# Patient Record
Sex: Male | Born: 1990 | Race: White | Hispanic: No | Marital: Married
Health system: Western US, Academic
[De-identification: ages and names within clinical notes are randomized; demographics above are authoritative.]

---

## 2017-12-07 ENCOUNTER — Emergency Department
Admission: EM | Admit: 2017-12-07 | Discharge: 2017-12-07 | Disposition: A | Discharge status: Home / Self Care | Payer: TRICARE Prime—HMO | Attending: Emergency Medicine | Admitting: Emergency Medicine

## 2017-12-07 ENCOUNTER — Emergency Department (EMERGENCY_DEPARTMENT_HOSPITAL): Payer: TRICARE Prime—HMO

## 2017-12-07 DIAGNOSIS — T703XXA Caisson disease [decompression sickness], initial encounter: Secondary | ICD-10-CM

## 2017-12-07 DIAGNOSIS — Y991 Military activity: Secondary | ICD-10-CM | POA: Insufficient documentation

## 2017-12-07 DIAGNOSIS — E876 Hypokalemia: Secondary | ICD-10-CM | POA: Insufficient documentation

## 2017-12-07 LAB — CBC WITH DIFF, BLOOD
ANC-Automated: 5.9 10*3/uL (ref 1.6–7.0)
Abs Basophils: 0 10*3/uL (ref <0.1)
Abs Eosinophils: 0.2 10*3/uL (ref 0.1–0.5)
Abs Lymphs: 2 10*3/uL (ref 0.8–3.1)
Abs Monos: 0.7 10*3/uL (ref 0.2–0.8)
Basophils: 0 %
Eosinophils: 2 %
Hct: 40 % (ref 40.0–50.0)
Hgb: 13.8 gm/dL (ref 13.7–17.5)
Lymphocytes: 22 %
MCH: 31.4 pg (ref 26.0–32.0)
MCHC: 34.5 g/dL (ref 32.0–36.0)
MCV: 90.9 um3 (ref 79.0–95.0)
MPV: 11.7 fL (ref 9.4–12.4)
Monocytes: 8 %
Plt Count: 232 10*3/uL (ref 140–370)
RBC: 4.4 10*6/uL — ABNORMAL LOW (ref 4.60–6.10)
RDW: 12.3 % (ref 12.0–14.0)
Segs: 68 %
WBC: 8.7 10*3/uL (ref 4.0–10.0)

## 2017-12-07 LAB — URINALYSIS WITH CULTURE REFLEX, WHEN INDICATED
Bilirubin: NEGATIVE
Blood: NEGATIVE
Glucose: NEGATIVE
Ketones: NEGATIVE
Leuk Esterase: NEGATIVE
Nitrite: NEGATIVE
Protein: NEGATIVE
Specific Gravity: 1.01 (ref 1.002–1.030)
Urobilinogen: NEGATIVE
pH: 7 (ref 5.0–8.0)

## 2017-12-07 LAB — UR DRUGS OF ABUSE SCREEN
Amphetamines Screen: NEGATIVE
Barbiturates Screen: NEGATIVE
Benzodiazepine Screen: NEGATIVE
Cocaine Screen: NEGATIVE
Methadone Screen: NEGATIVE
Opiates Screen: NEGATIVE
Oxycodone Screen: NEGATIVE
Phencyclidine Screen: NEGATIVE
THC Screen: NEGATIVE

## 2017-12-07 LAB — BASIC METABOLIC PANEL, BLOOD
Anion Gap: 14 mmol/L (ref 7–15)
BUN: 16 mg/dL (ref 6–20)
Bicarbonate: 25 mmol/L (ref 22–29)
Calcium: 10.2 mg/dL (ref 8.5–10.6)
Chloride: 99 mmol/L (ref 98–107)
Creatinine: 0.93 mg/dL (ref 0.67–1.17)
GFR: >60 mL/min
Glucose: 104 mg/dL — ABNORMAL HIGH (ref 70–99)
Potassium: 3.3 mmol/L — ABNORMAL LOW (ref 3.5–5.1)
Sodium: 138 mmol/L (ref 136–145)

## 2017-12-07 LAB — CARBOXYHEMOGLOBIN, VENOUS: CO Hgb, Ven: 1.3 %

## 2017-12-07 LAB — HCV ANTIBODY WITH REFLEX QUANT: Hepatitis C Ab: REACTIVE — AB

## 2017-12-07 MED ORDER — LACTATED RINGERS IV SOLN
Freq: Once | INTRAVENOUS | Status: AC
Start: 2017-12-07 — End: 2017-12-07
  Administered 2017-12-07: 02:00:00 via INTRAVENOUS

## 2017-12-07 NOTE — ED Notes (Signed)
12/07/2017 12:41 AM David Ballard    An EKG was handed to Dr. Randell Patientaccese. Signed copy placed in EKG binder.

## 2017-12-07 NOTE — ED MD Progress Note (Signed)
Patient completed HBO chamber dive unremarkably and stated his symptoms resolved. Remained HDS throughout sign-out stay, tolerated PO, and was ambulating per their baseline. Return precautions were discussed, patient expressed understanding and was amenable to follow-up plan.

## 2017-12-07 NOTE — Interdisciplinary (Signed)
12/07/17 0846   Assessment   Assessment Type Discharge  (transportation assistance )   Referral Information   Referral Type Discharge Planning     ED SW was asked by ED Nursing to assist pt with transportation.     ED SW secured Lyft ride to [place], per nursing request.     PLAN: ED SW to remain available as needed     Deirdre PriestGraciela Denice Dmitry Macomber, MSW  Pager: [phone]  Cell phone: [phone]

## 2017-12-07 NOTE — Consults (Signed)
Hyperbaric Medicine Consultation    Indication for treatment: Altitude Proofreader) decompression sickness    Physician requesting consultation: Ulysees Barns    Chief Complaint   Patient presents with   . Difficulty Breathing     Patient BIBM from [facility] d/t patient c/o difficulty breathing, weakness, and generalized numbness and tingling and transient disorientation s/p rapidly decending from approximately 6,000 feet from an [employer].  Patient denies chest pain/pressure, N/V, headache, or syncope.  Patient denies PMH.          History of present illness:   27 year old M with no PMHx who presents to [place] ED for complications related to aircraft malfunction.    Patient on 12/02/2017 initially had a routine flight and upon landing he felt some left arm numbness. His co-pilot felt some changes in pressure in the aircraft in that flight, but when the aircraft was analyzed there was no issues found. The patient was cleared to go back to flying last night on 12/06/2017.    His first flight of the night was uneventful. During the second flight of the night, he went up to 12,000 feet. At that time there was an electrical alarm and both him and his WSO were required to start breathing 100% oxygen and come down to 10,000 feet. At that point the pilot felt as though he lost flow to his mask and felt symptoms of hypoxia including tunnel vision and disorientation. The decision was made to come back to shore. The cockpit at that time started to have violent fluctuations in cabin pressure, at least between 4-12,000 feet and possibly more since it was pitch black and the pilot could no longer see the controls. This continued for at least 25 minutes and the pressure would change around every 3 minutes. They were finally able to fly at ambient pressure for about 25 minutes before reaching the shore around 23:30 on Dec 1st.     The patient's main complaint upon landing was disorientation. Currently the patient feels  heaviness in his legs, numbness and tingling in his left leg and especially in his left foot, numbness in some of his toes on the right foot, numbness in his left leg along with some cramping, and some numbness and tingling in his left hand. He also feels some tingling in his chest but he denies any chest pain.     He had a WSO on the flight with him that did confirm the story but he did not have any symptoms of decompression sickness. He is completely asymptomatic currently. He also was not the WSO that he had several days ago when David Ballard developed symptoms of left arm numbness.    He denies a history of seizures, lung disease, sinus disease, or difficulty clearing his ears.      Past Medical History:  No dm or htn  No history of decompression sickness    Medications:  No current facility-administered medications for this encounter.      No current outpatient medications on file.       Allergies:  Patient has no known allergies.    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         ROS:  Constitutional: negative.  Eyes: negative.  Ears, Nose, Mouth, Throat: negative but some ear pain on his left  side.  CV: negative.  Resp: negative.  Musculoskeletal: negative.  Integumentary: negative.  Neuro: numbness or tingling.  Psych: negative.  Endo: negative.  Heme/Lymphatic: negative.  A complete ROS was done and was negative except as stated above.      Physical Exam:    Vitals: BP 125/74   Pulse 85   Temp 98.3 F (36.8 C)   Resp 16   Ht 5\' 7"  (1.702 m)   Wt 77.1 kg (170 lb)   SpO2 100%   BMI 26.63 kg/m   Vs noted. Afebrile. o2 sat is 100% on room air which is normal.   General Appearance: healthy, alert, no distress, pleasant affect, cooperative.  HEENT: ncat  Eyes: conjunctivae and corneas clear. PERRL, EOM's intact.  Ears: tympanic membranes mobile with valsalva, left canal erythematous and  right canal normal  Heart: normal rate and regular rhythm, no murmurs, clicks, or gallops.  Lungs: clear to auscultation and percussion, no chest deformities noted.  Back: no cvat. No spinal ttp.   Abdomen: Abdomen soft, non-tender. No masses or organomegaly. Bowel sounds normal.  Extremities: no cyanosis, clubbing, or edema.  Neurological Exam: CN II-XII present. No weakness noted in all 4 extremities. Sensation decreased to lt in the left leg.  Normal fnf bilaterally. Normal gait.    Mini-mental exam: some difficulty with subtraction section (count back from 100 by 7). He also did not know he was at [place] but he knew exactly where he was transferred from and [place]    WoundDescription: N/A    Impression:  1. Aviation or Altitude decompression sickness  David Ballard is an excellent candidate for hyperbaric oxygen (HBO2) treatment as primary therapy for the treatment of Decompression Sickness. He had multiple violent and immediate cabin pressure changes with residual neurological symptoms upon landing.    Informed Consent:  David Ballard has no contraindications to hyperbaric oxygen therapy.  We have discussed the risks (ear and sinus barotrauma, pneumothorax, visual refractory changes,oxygen-induced seizures, arterial gas embolism, and claustrophobia) and potential benefits of hyperbaric oxygen therapy with David Ballard.  He understands these risks along with the potential benefits and agrees to undergo hyperbaric oxygen therapy.    Plan:   We are ordering a course of therapy with a treatment table 6 with 100% oxygen immediately. We will continue to assess his status during treatment and assess if he will need any extensions or further treatments.    Dahlia BailiffMahmoud Sabha, MD  [facility] Hyperbaric Medicine Fellow    Hyperbaric attending physician attestation:  The patient was seen and evaluated with the hyperbaric fellow, Dr. Dahlia BailiffMahmoud Sabha.  I have reviewed the above note and made changes where  appropriate.  I agree with the history, physical exam, assessment, and plan.  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  He felt immediate symptoms including numbness in his left leg and both feet, generalized weakness and malaise, and decreased cognitive ability.  On exam he had some decreased sensation to light touch in his left leg.  He also had problems on the mini mental status exam doing serial 7's.  We plan to treat him with 100% oxygen on a TT-6.  No flying until cleared by his Sport and exercise psychologistflight surgeon.

## 2017-12-07 NOTE — ED Notes (Signed)
Pt brought back from hyperbarics by Dr. Noralee StainGrover

## 2017-12-07 NOTE — ED MD Progress Note (Signed)
Pt completed a TT-6 without any problems.  He states his symptoms have 95% resolved.  His cognitive abilities are back to normal.  No other issues.  Pt states he feels "a lot better."  Follow up with flight surgeon as soon as possible.  No flying until cleared by a Sport and exercise psychologistflight surgeon.

## 2017-12-07 NOTE — ED EKG Interpretation (Signed)
ED EKG Interpretation    ECG NSR 66 nl axes intervals without e/o ischemia, no comparison

## 2017-12-07 NOTE — ED MD Progress Note (Signed)
Sign out from - Ishimine/Caccese    Brief hx and course:   - pilot, hot/cold sensation maybe parasthesias in feet, had possibly malfunctioning pressure on flight in the last 4 days, almost passed out  - diving now w/ hyperbarics  - rule out decompression sickness  - otherwise healthy    Plan:   - f/u HBO, in the chamber now    Labs  Results for orders placed or performed during the hospital encounter of 12/07/17   Basic Metabolic Panel, Blood Green Plasma Separator Tube   Result Value Ref Range    Glucose 104 (H) 70 - 99 mg/dL    BUN 16 6 - 20 mg/dL    Creatinine 1.61 0.96 - 1.17 mg/dL    GFR >04 mL/min    Sodium 138 136 - 145 mmol/L    Potassium 3.3 (L) 3.5 - 5.1 mmol/L    Chloride 99 98 - 107 mmol/L    Bicarbonate 25 22 - 29 mmol/L    Anion Gap 14 7 - 15 mmol/L    Calcium 10.2 8.5 - 10.6 mg/dL   CBC w/ Diff Lavender   Result Value Ref Range    WBC 8.7 4.0 - 10.0 1000/mm3    RBC 4.40 (L) 4.60 - 6.10 mill/mm3    Hgb 13.8 13.7 - 17.5 gm/dL    Hct 54.0 98.1 - 19.1 %    MCV 90.9 79.0 - 95.0 um3    MCH 31.4 26.0 - 32.0 pgm    MCHC 34.5 32.0 - 36.0 g/dL    RDW 47.8 29.5 - 62.1 %    MPV 11.7 9.4 - 12.4 fL    Plt Count 232 140 - 370 1000/mm3    Segs 68 %    Lymphocytes 22 %    Monocytes 8 %    Eosinophils 2 %    Basophils 0 %    ANC-Automated 5.9 1.6 - 7.0 1000/mm3    Abs Lymphs 2.0 0.8 - 3.1 1000/mm3    Abs Monos 0.7 0.2 - 0.8 1000/mm3    Abs Eosinophils 0.2 <0.1 - 0.5 1000/mm3    Abs Basophils 0.0 <0.1 1000/mm3    Diff Type Automated    Carboxyhemoglobin, Venous Heparin Syringe   Result Value Ref Range    CO Hgb, Ven 1.3 %   Urinalysis with Culture Reflex, when indicated   Result Value Ref Range    Type Not Specified     Color Yellow Yellow    Appearance Clear Clear    Specific Gravity 1.010 1.002 - 1.030    pH 7.0 5.0 - 8.0    Protein Negative Negative    Glucose Negative Negative    Ketones Negative Negative    Bilirubin Negative Negative    Blood Negative Negative    Urobilinogen Negative Negative    Nitrite  Negative Negative    Leuk Esterase Negative Negative    WBC 0-2 0-2/HPF    RBC 0-2 0-2/HPF    Bacteria None None-Rare/HPF   Urine Immunoassay Drug Screen   Result Value Ref Range    Amphetamines Screen Negative Negative    Barbiturates Screen Negative Negative    Cocaine Screen Negative Negative    Benzodiazepine Screen Negative Negative    Methadone Screen Negative Negative    Opiates Screen Negative Negative    Oxycodone Screen Negative Negative    Phencyclidine Screen Negative Negative    THC Screen Negative Negative    UR Drug Screen Interpretation See Comment Negative  Diagnostic Studies  X-Ray Chest Single View    (Results Pending)         Latest vital signs:   Vitals:    12/07/17 0033 12/07/17 0304   BP: 139/80 125/74   Pulse: 67 85   Resp: 18 16   Temp: 98.3 F (36.8 C)    SpO2: 100% 100%   Weight: 77.1 kg (170 lb)    Height: 5\' 7"  (1.702 m)

## 2017-12-07 NOTE — ED Provider Notes (Signed)
Emergency Department Provider Note    Patient: David Ballard Ballard, MRN [id number], DOB 06/06/1990  The Date of Service for the Emergency Room encounter is 12/07/2017 12:25 AM   Chief Complaint   Patient presents with   . Difficulty Breathing     [facility] d/t patient c/o difficulty breathing, weakness, and generalized numbness and tingling and transient disorientation s/p rapidly decending from approximately 6,000 feet from an [employer].  Patient denies chest pain/pressure, N/V, headache, or syncope.  Patient denies PMH.         HPI:   David Ballard is a 27 year old male with no past medical history who presents to the emergency department after rapid descent during flight.  Per report, patient is an Catering manager-8 pilot and was flying in approximately 10000 feet 2 hours prior to presentation.  Patient states there was an electronic malfunction and they lost oxygen and had to descend to 6000  feet rapidly.  However, the cabin pressure system also malfunctioned and was switching between 6000-10000 feet intermittently every few seconds over the ensuing 25 minutes.  During this time, patient reports he became confused/disoriented and had numbness and tingling over his body (left greater than right).  Patient states tingling sensation has improved but and reports improvement in mental status.  Patient otherwise denies any shortness of breath, chest pain, headache, vision changes or weakness.      Of note, patient had an episode of hypoxia while flying approximately 4 days ago and was grounded for 72 hours.  Patient presents with Flight surgeon who recommended obtaining a chest x-ray, BMP, CBC, urinalysis and carbon monoxide level.  Full review of systems as per below    Patient's medical history has been reviewed today as available in EPIC chart.  Primary MD: No primary care provider on file.    ROS:   Review of Systems   Constitutional: Negative for chills and fever.   HEENT: Negative for  congestion and rhinorrhea.    Respiratory: Negative for cough and shortness of breath  Cardiovascular: Negative for chest pain, palpitations  Gastrointestinal: Negative for abdominal pain, constipation, diarrhea, nausea and vomiting.   GU: Negative for dysuria and hematuria  Skin: Negative for rash and wound.   Neurological: Negative for seizures, syncope.   Psychiatric/Behavioral: Negative for confusion and sleep disturbance.     Home Medications:  None       Allergies: Patient has no known allergies.    Past Medical/Surgical History:   No past medical history on file.  No past surgical history on file.    Family History:   No family history on file.    Social History: Denies alcohol, tobacco, or illicit drug use  Social History     Tobacco Use   . Smoking status: Not on file   Substance Use Topics   . Alcohol use: Not on file   . Drug use: Not on file       Physical Exam  Vitals:    12/07/17 0033 12/07/17 0304   BP: 139/80 125/74   Pulse: 67 85   Resp: 18 16   Temp: 98.3 F (36.8 C)    SpO2: 100% 100%   Weight: 77.1 kg (170 lb)    Height: 5\' 7"  (1.702 m)    Nursing note and vitals reviewed.   Constitutional: oriented to person, place, and time. Appears well-developed and well-nourished. No acute distress.   Eyes: EOMI, PERRL. No conjunctival injection.   Neck:  Supple, Trachea midline, no stridor  Cardiovascular: RRR, no gallops, rubs or murmurs appreciated, distal pulses intact  Pulmonary/Chest: No respiratory distress. CTAB, no wheezes, rhonchi, or rales . No chest wall tenderness.   Abdominal: Soft. No distension, no tenderness and no masses.    MSK: No LE edema, e/o recent trauma.   Psychiatric: Normal affect. Mood not labile nor depressed.   Skin: warm/dry  Neurologic Exam:   MS: A&O to person/place/time.   CN: Cranial nerves II-XII intact: normal VFs, PERRL, EOMI, facial strength and sensation intact, hearing intact, trapezius strength intact, tongue midline.   Motor: Normal bulk and strength bilaterally in:  biceps, triceps, wrist extension, finger extension, hip flexion, ankle dorsiflexion and plantarflexion, toe extension. No myoclonus. No pronator drift.   Sensory: Sensation intact to light touch in all 4 extremities.  DTRs: patella/tricep 2+  Gait: Normal gait with no ataxia. Normal heel and toe walk. Normal tandem gait. Negative romberg.   Cerebellar: Normal FNF, HTS, normal finger taps bilaterally.     ED Course & Clinical Decision Making:  27 year old  male with PMHx as listed in HPI presents with paresthesias and confusion during flight malfunction while flying his F-18.  Vitals reviewed found to be within normal limit.  Physical exam as per above, patient complaining of subjective numbness of both bottom feet, however on exam this is not reproducible and remainder of neurological exam is nonfocal.  Given above findings, presentation concerning for possible decompression sickness. Paresthesias could also be caused by hyperventilation/carpopedal spasms. Low clinical suspicion for acute air embolism or pulmonary barotrauma.  Will plan to consult hyperbaric.    Flight Surgeon:  David Ballard  [phone]      Reassessment:  - BMP notable for mild hypokalemia, otherwise within normal limits carbon monoxide wnl, U tox negative and urinalysis within normal limits.  CBC within normal limits.  - chest x-ray without acute cardiopulmonary disease  - hyperbaric plan to take patient to chamber for dive, can be discharged afterwards    S/O  - DC after dive        Orders  Orders Placed This Encounter   Procedures   . X-Ray Chest Single View   . Basic Metabolic Panel, Blood Green Plasma Separator Tube   . CBC w/ Diff Lavender   . Carboxyhemoglobin, Venous Heparin Syringe   . Urinalysis with Culture Reflex, when indicated   . Urine Immunoassay Drug Screen   . IP Consult to Hyperbaric Medicine       Medications   lactated ringers 1,000 mL IV bolus ( IntraVENOUS Stopped 12/07/17 0303)       Labs  Results for orders placed or  performed during the hospital encounter of 12/07/17   Basic Metabolic Panel, Blood Green Plasma Separator Tube   Result Value Ref Range    Glucose 104 (H) 70 - 99 mg/dL    BUN 16 6 - 20 mg/dL    Creatinine 0.98 1.19 - 1.17 mg/dL    GFR >14 mL/min    Sodium 138 136 - 145 mmol/L    Potassium 3.3 (L) 3.5 - 5.1 mmol/L    Chloride 99 98 - 107 mmol/L    Bicarbonate 25 22 - 29 mmol/L    Anion Gap 14 7 - 15 mmol/L    Calcium 10.2 8.5 - 10.6 mg/dL   CBC w/ Diff Lavender   Result Value Ref Range    WBC 8.7 4.0 - 10.0 1000/mm3    RBC 4.40 (L)  4.60 - 6.10 mill/mm3    Hgb 13.8 13.7 - 17.5 gm/dL    Hct 60.4 54.0 - 98.1 %    MCV 90.9 79.0 - 95.0 um3    MCH 31.4 26.0 - 32.0 pgm    MCHC 34.5 32.0 - 36.0 g/dL    RDW 19.1 47.8 - 29.5 %    MPV 11.7 9.4 - 12.4 fL    Plt Count 232 140 - 370 1000/mm3    Segs 68 %    Lymphocytes 22 %    Monocytes 8 %    Eosinophils 2 %    Basophils 0 %    ANC-Automated 5.9 1.6 - 7.0 1000/mm3    Abs Lymphs 2.0 0.8 - 3.1 1000/mm3    Abs Monos 0.7 0.2 - 0.8 1000/mm3    Abs Eosinophils 0.2 <0.1 - 0.5 1000/mm3    Abs Basophils 0.0 <0.1 1000/mm3    Diff Type Automated    Carboxyhemoglobin, Venous Heparin Syringe   Result Value Ref Range    CO Hgb, Ven 1.3 %   Urinalysis with Culture Reflex, when indicated   Result Value Ref Range    Type Not Specified     Color Yellow Yellow    Appearance Clear Clear    Specific Gravity 1.010 1.002 - 1.030    pH 7.0 5.0 - 8.0    Protein Negative Negative    Glucose Negative Negative    Ketones Negative Negative    Bilirubin Negative Negative    Blood Negative Negative    Urobilinogen Negative Negative    Nitrite Negative Negative    Leuk Esterase Negative Negative    WBC 0-2 0-2/HPF    RBC 0-2 0-2/HPF    Bacteria None None-Rare/HPF   Urine Immunoassay Drug Screen   Result Value Ref Range    Amphetamines Screen Negative Negative    Barbiturates Screen Negative Negative    Cocaine Screen Negative Negative    Benzodiazepine Screen Negative Negative    Methadone Screen Negative  Negative    Opiates Screen Negative Negative    Oxycodone Screen Negative Negative    Phencyclidine Screen Negative Negative    THC Screen Negative Negative    UR Drug Screen Interpretation See Comment Negative       Diagnostic Studies  No results found.      Patient seen and discussed with ED attending, Ishimine, Ilda Foil, MD.     Irving Copas, MD, PGY-3  Emergency Medicine       Irving Copas, MD  Resident  12/07/17 [phone]       Jeanie Sewer, MD  12/07/17 [phone]

## 2017-12-07 NOTE — Plan of Care (Signed)
Hyperbaric Oxygen Therapy Plan of Care       Patient will not experience any barotraumas   Interventions:  Prior to treatment patient was taught ear equalizations techniques, such as  swallowing, chewing, yawning and Valsalva maneuver. MD or RN will inform the  HBO team if there is a predetermined problem that would make equalization  difficult.   During each pressurization the HBO team will assess patient's performance of  ear equalization techniques.   The inside HBO team member will notify the chamber operator if patient is  unable or is having difficulty in equalization ears.    The HBO team will monitor the patient during hyperbaric therapy for signs and  symptoms of barotraumas to ears, sinuses or lungs.     Patient will not have an oxygen toxicity event              Interventions:  The HBO team will assess patient prior to each hyperbaric treatment for increased temperature, history of seizures or use of seizure provoking medications.  The HBO team will assess the patient during the treatment for signs and symptoms of central nervous system oxygen toxicity, which may include blurred vision, ringing in the ears, nausea, numbness, twitching, restlessness and vertigo.   The HBO team will assess patients during the hyperbaric therapy for signs and symptoms of pulmonary oxygen toxicity, which may include, but are not limited to, tightness of chest, dry, hacking cough, difficulty inhaling a full breath and dyspnea on exertion.   Patient will have adequate oxygenation during the hyperbaric treatment       Interventions:  The HBO team will assess the patient's condition, needs and limitations for the most appropriate oxygen delivery system.  The HBO team will monitor the patient's response to the oxygen delivery system, including the ability to tolerate chosen system.     Patient will tolerate hyperbaric treatment and experience little or no anxiety.            Interventions:  HBO MD will assess the patient for any  history of claustrophobia and relay the information to the hyperbaric care team.  The HBO team will implement preventative measures as appropriate such as education, chamber tour and or medication as ordered.  During the hyperbaric treatment The HBO team will monitor and assess for restlessness, inability to tolerate hood and statements by the patient that indicate confinement anxiety.  HBO treatment team will notify the hyperbaric MD or RN of patient's response to anti-anxiety measures or continued degenerating ability to tolerate confinement.     Patient will have adequate pain control and comfort during hyperbaric treatment     Interventions:  The HBO team will assess the patient's experience of pain and whether pain has increased during the treatment.  If necessary HBO RN or MD will medicate the patient for pain or have the patient take his or her outpatient prescribed pain medication prior to the hyperbaric treatment.  The HBO team will enhance the patients comfort by using pillows for repositioning and the ECU for temperature control.

## 2017-12-07 NOTE — ED Notes (Signed)
No new deficits noted thus far from initial assessment, in NAD.  Pt AA&Ox4, ambulating with steady gait, denies any pain nor dizziness with positional changes at this time.  Discharge instructions, medications, return criteria, f/u care reviewed with pt, questions answered, copy provided; pt verbalized understanding, signed ACI.  [place]  Pt with all personal belongings at time of discharge.  [place].

## 2017-12-07 NOTE — Interdisciplinary (Signed)
Technician Notes:        Patient presents to the Hyperbaric Department A/O x 4, ambulatory. Vitals are taken, discrepancies reported to supervising MD, and noted. Patient is escorted into the hyperbaric chamber  walking with assistance, patient is semi-fowlers on the chamber bunk. Pre-dive safety check is completed (Right Patient, Right Treatment Profile, Right Safety Measures). Patient is able to equalize ears well to 60 FSW, with a 2 FSW safety stop and reports "okay", with no complications. Patient is placed on 100% oxygen via hood system  for a total of 3 X 20 minute oxygen periods, seperated by 3 x 5 minute air breaks at 60 FSW. Pt ascends on oxygen to 30 FSW travelling NFT 1 FPM for a total of 6 X 20 minute oxygen periods, seperated by 6 x 5 minute air breaks at 30 FSW, and then ascends to the surface on oxygen travelling NFT 1 FPM. Pt tolerates hyperbaric oxygen treatment well, and without complication. Pt is escorted out of hyperbaric chamber via walking with assistance. Pt is released as back to the emergency department per supervising MD.

## 2017-12-07 NOTE — ED Notes (Signed)
Patient taken down to hyperbarics at this time with MD Noralee StainGrover at side.

## 2017-12-07 NOTE — Discharge Instructions (Signed)
You are recommended to follow-up with your primary care doctor within 5 - 7 days for a re-evaluation of your symptoms. Please continue taking your home medications as prescribed by your doctor    Please return to the emergency department if you experience worsening pain, fevers, worsening nausea and/or vomiting, loss of consciousness and/or difficulty breathing, or for any other concerns you may have about your health.     Thank you for your visit and we hope you feel better.

## 2017-12-07 NOTE — ED Notes (Signed)
Pt being discharged to [facility] via cab per SW.

## 2017-12-08 LAB — ECG 12-LEAD
ATRIAL RATE: 66 {beats}/min
ECG INTERPRETATION: NORMAL
P AXIS: 37 degrees
PR INTERVAL: 174 ms
QRS INTERVAL/DURATION: 94 ms
QT: 386 ms
QTC INTERVAL: 404 ms
R AXIS: 42 degrees
T AXIS: 31 degrees
VENTRICULAR RATE: 66 {beats}/min

## 2017-12-08 LAB — HEPATITIS C RNA, QUANT BLOOD: Hepatitis C RNA Quant: NOT DETECTED [IU]/mL

## 2019-04-06 ENCOUNTER — Encounter (INDEPENDENT_AMBULATORY_CARE_PROVIDER_SITE_OTHER): Payer: Self-pay

## 2019-11-15 IMAGING — MR MRI LSPINE WO CONTRAST
4 of 5 series · 31 of 48 positions shown · non-contrast
Comparison: None.

INDICATION: Chronic progressive lumbar pain.
TECHNIQUE: Multiplanar, multiecho MR imaging of the lumbar spine was performed, including T1-weighted and fluid-sensitive sequences without intravenous contrast.

[Series 16: t2_sag · sagittal · 4.0mm · 0.74mm/px · 7 of 19 slices shown]
[im 1/19]
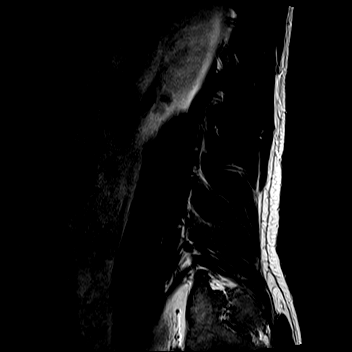
[im 4/19]
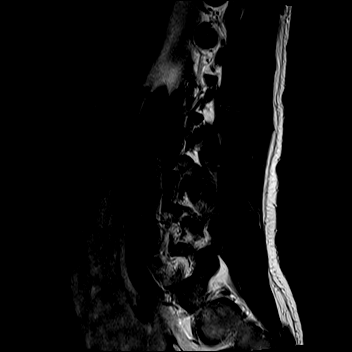
[im 7/19]
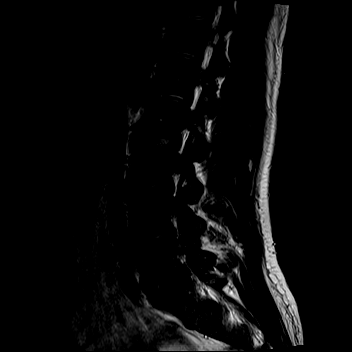
[im 10/19]
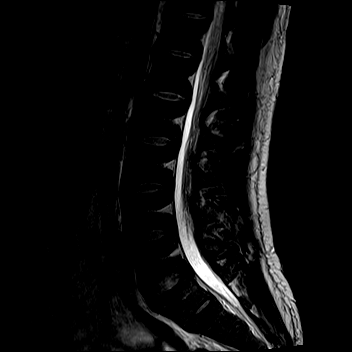
[im 13/19]
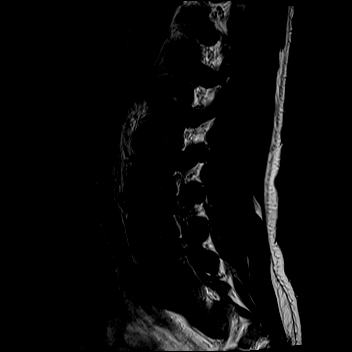
[im 16/19]
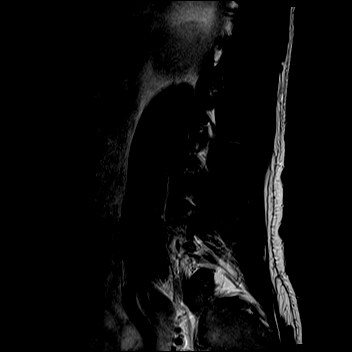
[im 19/19]
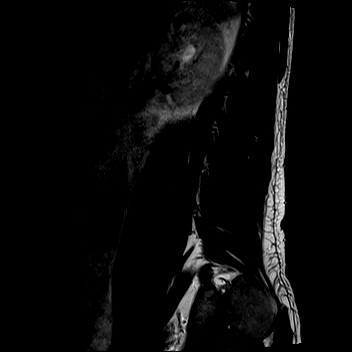

[Series 17: t1_sag · sagittal · 4.0mm · 0.81mm/px · 7 of 19 slices shown]
[im 1/19]
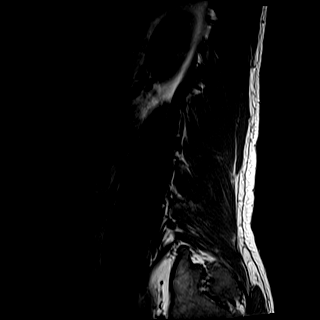
[im 4/19]
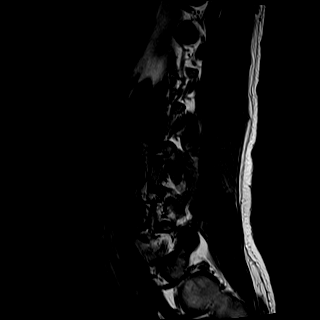
[im 7/19]
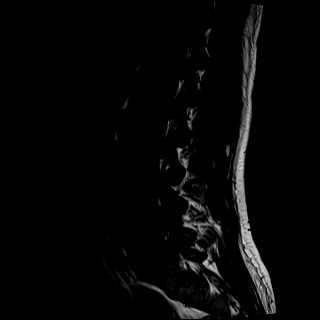
[im 10/19]
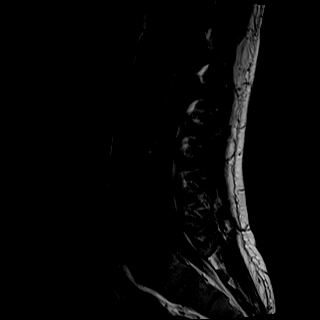
[im 13/19]
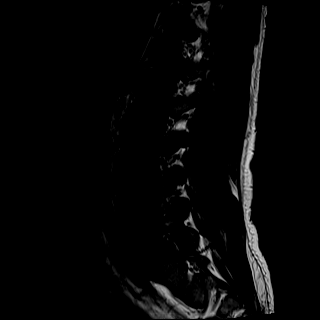
[im 16/19]
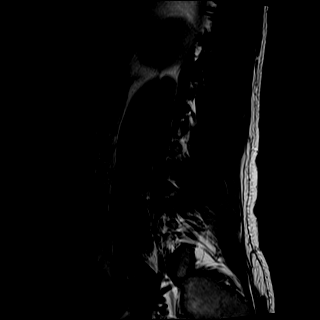
[im 19/19]
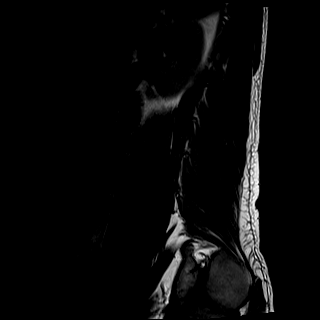

[Series 18: ir_sag · sagittal · 4.0mm · 0.45mm/px · 7 of 19 slices shown]
[im 1/19]
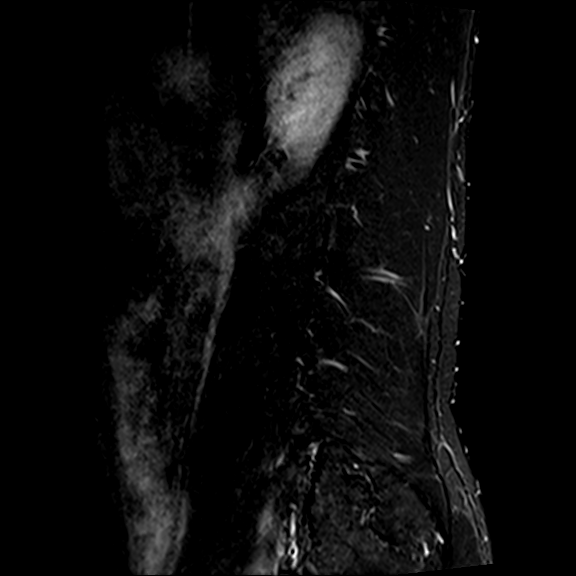
[im 4/19]
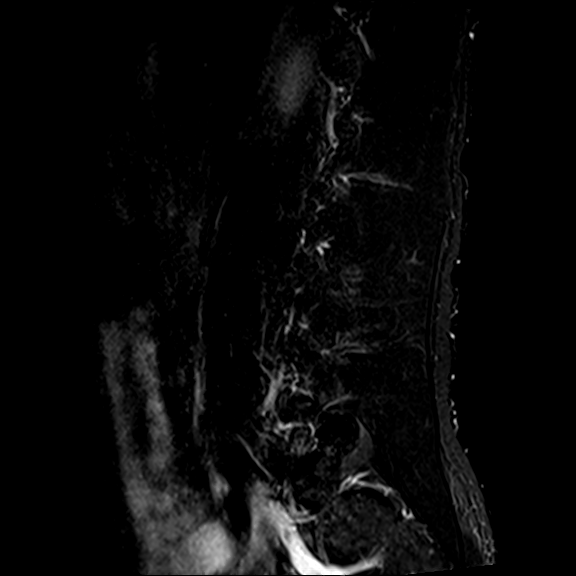
[im 7/19]
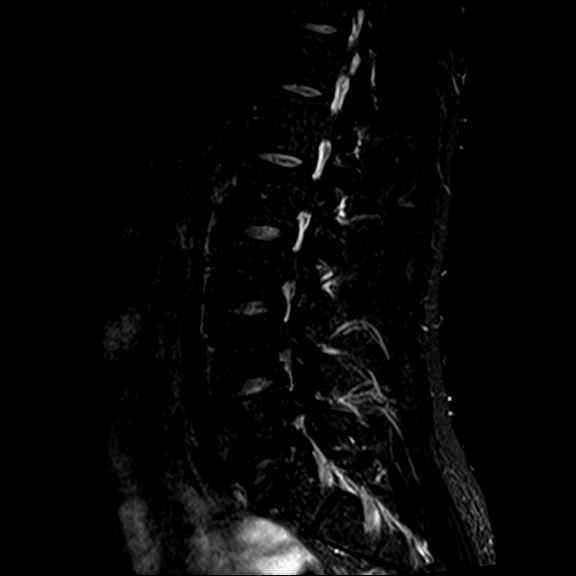
[im 10/19]
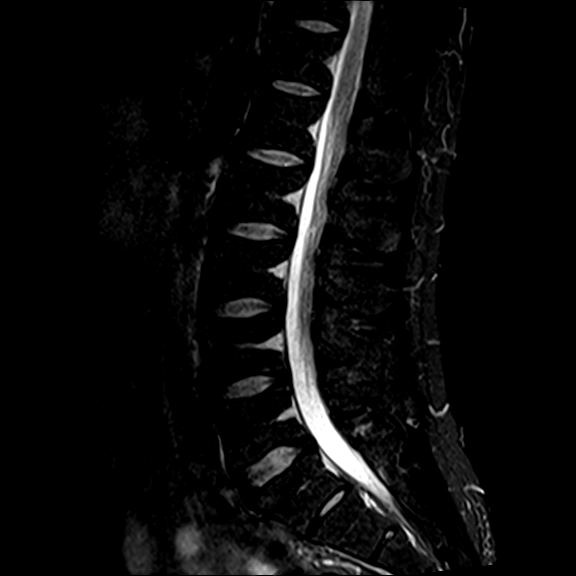
[im 13/19]
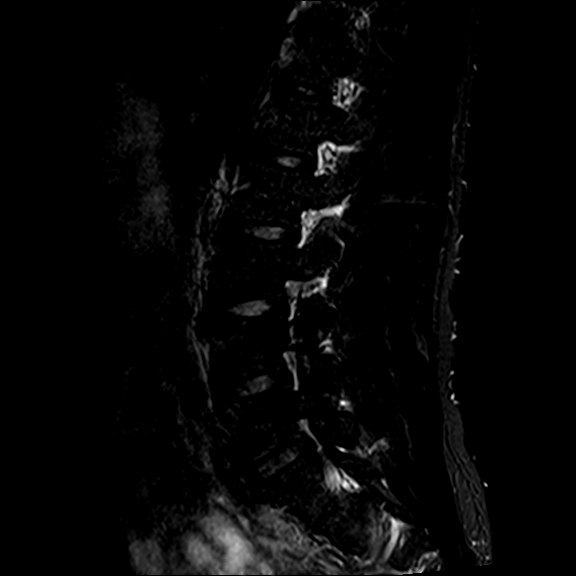
[im 16/19]
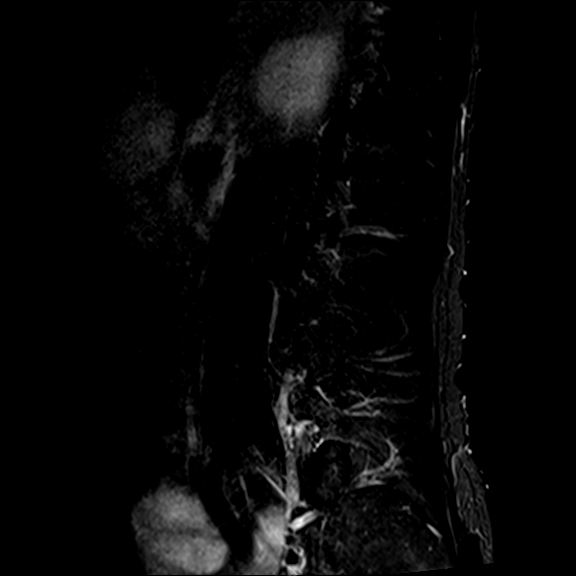
[im 19/19]
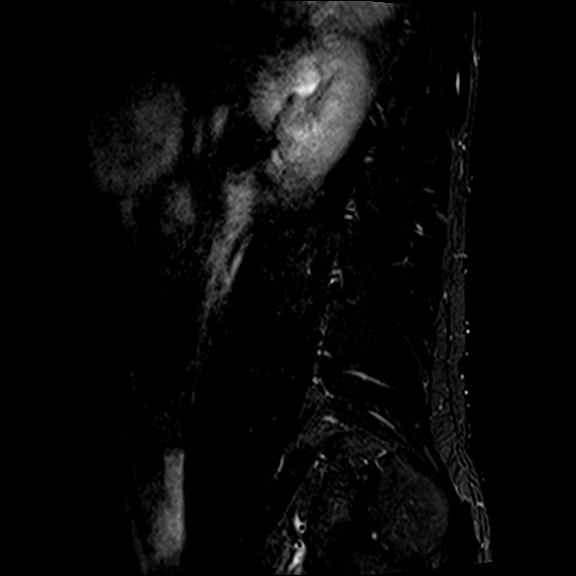

[Series 19: t2_axial · axial · 4.0mm · 0.62mm/px · z∈[-509,-341]mm · 10 of 44 slices shown]
[im 3/44]
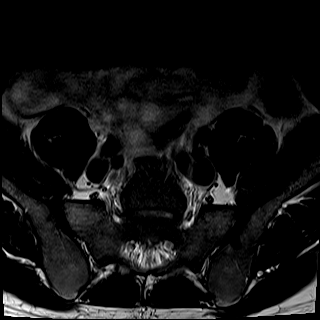
[im 6/44]
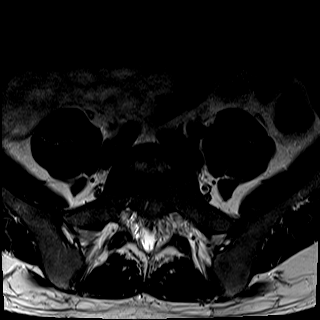
[im 9/44]
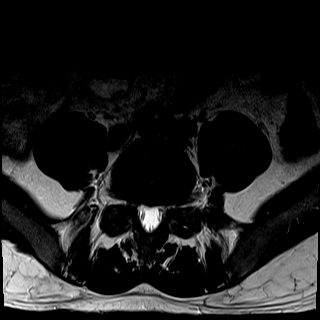
[im 14/44]
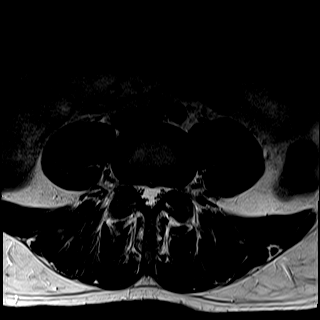
[im 19/44]
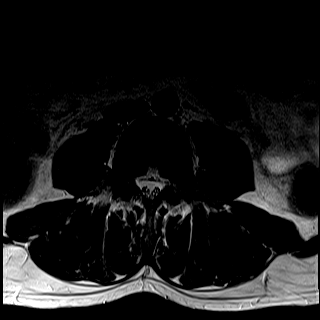
[im 22/44]
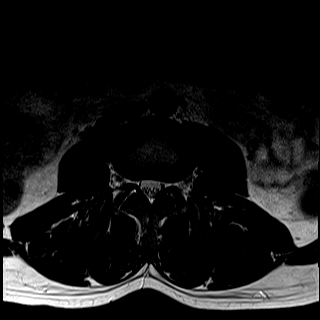
[im 25/44]
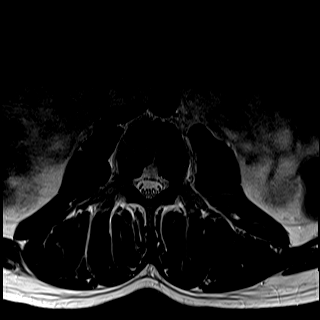
[im 30/44]
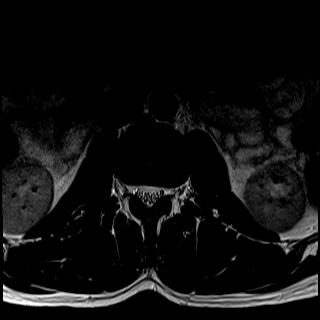
[im 35/44]
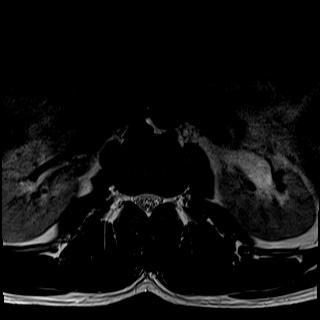
[im 38/44]
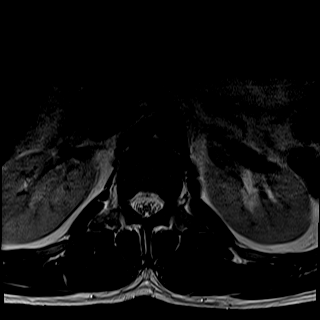

[31 of 48 positions shown; findings below may reference images not displayed]

FINDINGS: Conus: The conus is normal in appearance and position.  

Alignment: The alignment of the lumbar spine is normal on these supine, neutral images.   

Marrow: No acute fracture. No pars defect.  

T11-T12 and T12-L1 levels are seen only on the sagittal images; no posterior disc bulge, canal narrowing, or foraminal narrowing at these levels.

L1-L2: No disc protrusion, canal stenosis, or foraminal stenosis.

L2-L3: No disc protrusion, canal stenosis, or foraminal stenosis.

L3-L4: No disc protrusion, canal stenosis, or foraminal stenosis.

L4-L5: Minimal disc bulge. Mild bilateral facet arthrosis. No canal stenosis. Mild bilateral foraminal stenoses.

L5-S1: Mild disc bulge. Mild bilateral facet arthrosis. Moderate bilateral lateral recess narrowing, worse on the right. Minimal canal stenosis. Mild bilateral foraminal stenoses.

Visualized SI joints: Minimal DJD bilaterally.

Visualized soft tissues: Unremarkable
IMPRESSION: 1. Mild disc bulge and facet arthrosis at L5-S1 with moderate bilateral lateral recess narrowing and mild bilateral foraminal stenoses.

2. Minimal disc bulge and mild facet arthrosis at L4-L5 with mild bilateral foraminal stenoses.

## 2019-11-16 IMAGING — MR MRI CSPINE WO CONTRAST
4 series · 35 of 48 positions shown · IV contrast (agent unspecified)
Comparison: None.

HISTORY: 29-year-old male. Chronic neck pain.
TECHNIQUE: Multiplanar, multisequence MR images of the cervical spine were obtained without intravenous contrast. 

CONTRAST: None.

[Series 16: t2_sag · sagittal · 3.0mm · 0.69mm/px · 11 of 17 slices shown]
[im 1/17]
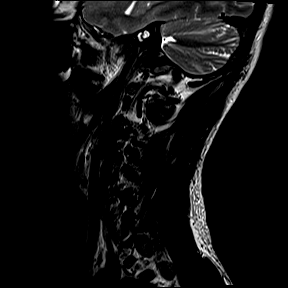
[im 2/17]
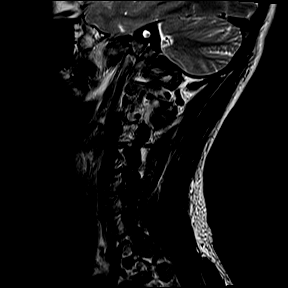
[im 4/17]
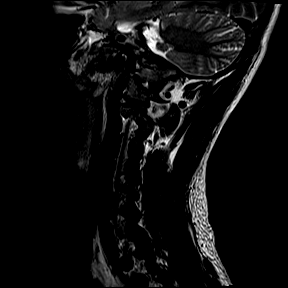
[im 5/17]
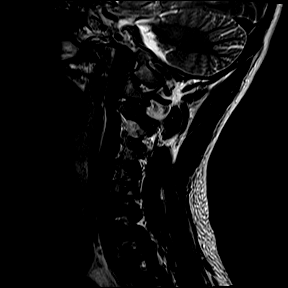
[im 7/17]
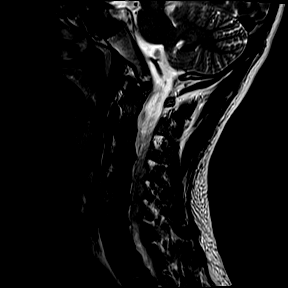
[im 9/17]
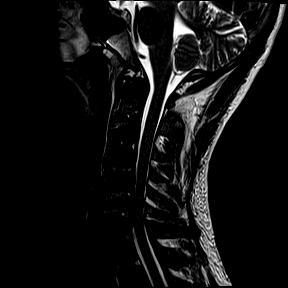
[im 10/17]
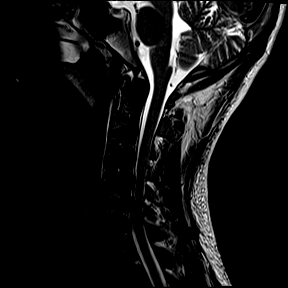
[im 12/17]
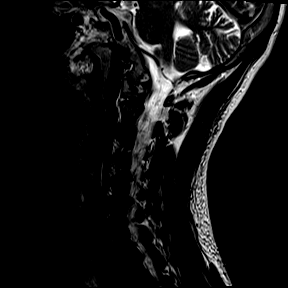
[im 13/17]
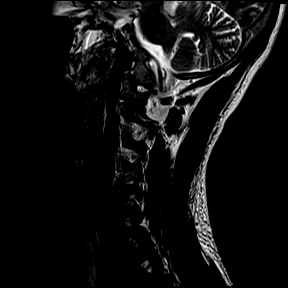
[im 15/17]
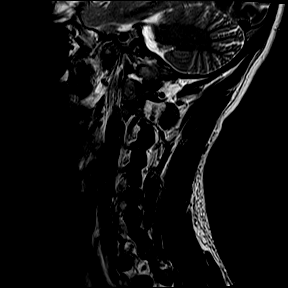
[im 17/17]
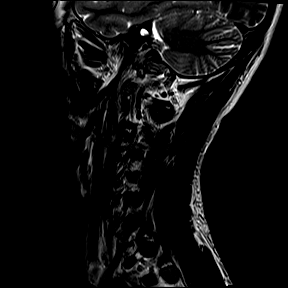

[Series 17: t1_sag · sagittal · 3.0mm · 0.69mm/px · 8 of 17 slices shown]
[im 1/17]
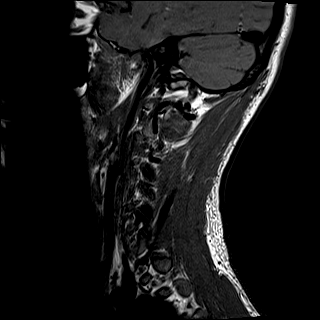
[im 2/17]
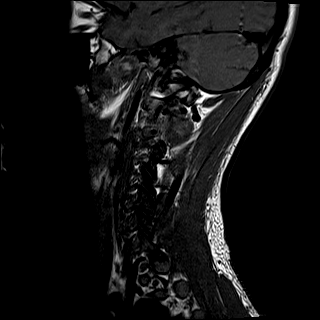
[im 6/17]
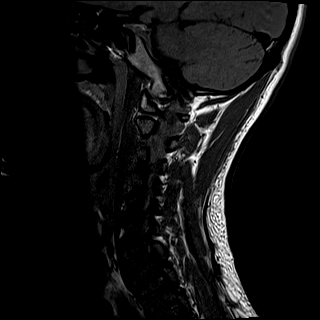
[im 8/17]
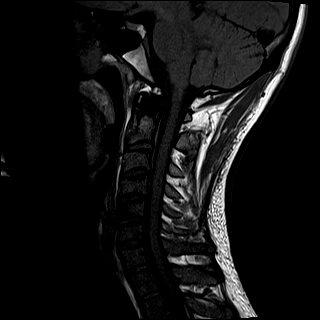
[im 9/17]
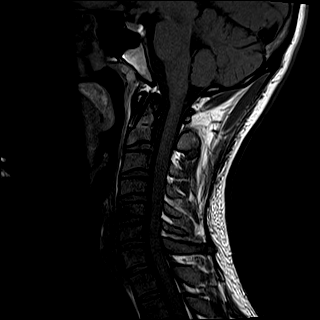
[im 11/17]
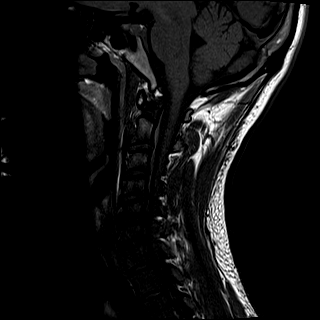
[im 15/17]
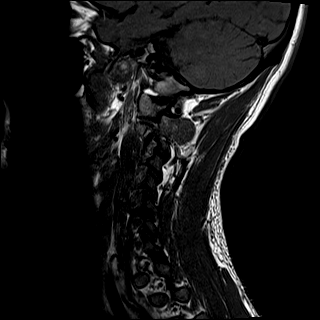
[im 17/17]
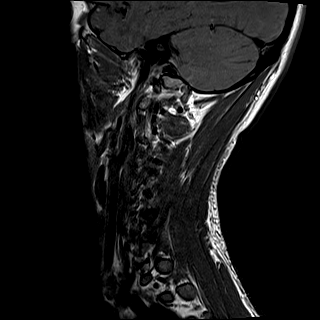

[Series 18: ir_sag · sagittal · 3.0mm · 0.86mm/px · 8 of 17 slices shown]
[im 1/17]
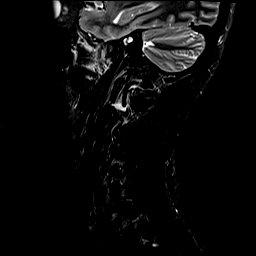
[im 2/17]
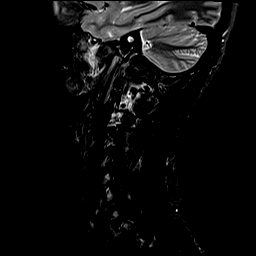
[im 6/17]
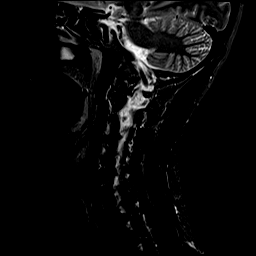
[im 8/17]
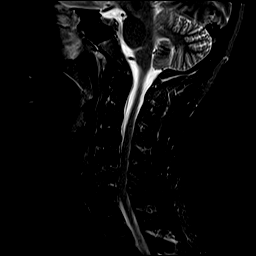
[im 9/17]
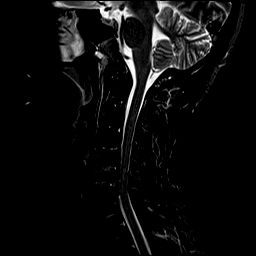
[im 11/17]
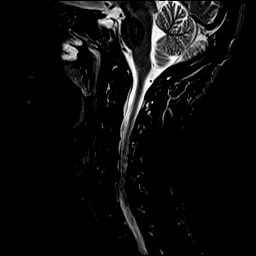
[im 15/17]
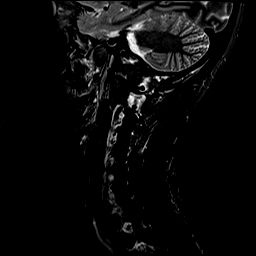
[im 17/17]
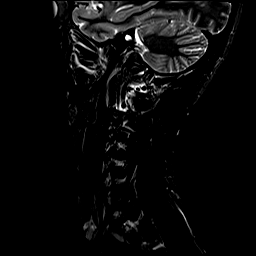

[Series 19: t2_medic_axial · axial · 3.0mm · 0.35mm/px · z∈[-66,+24]mm · 8 of 29 slices shown]
[im 2/29]
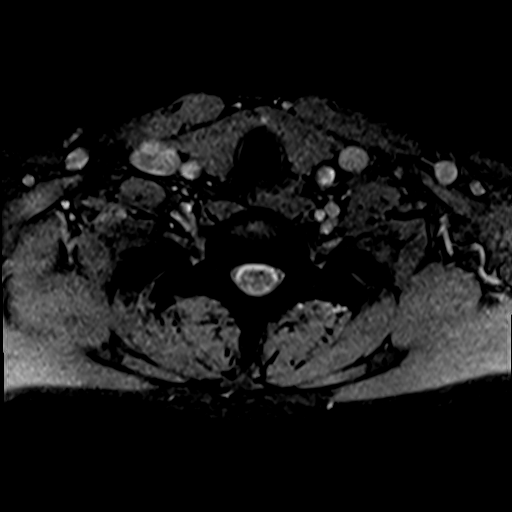
[im 4/29]
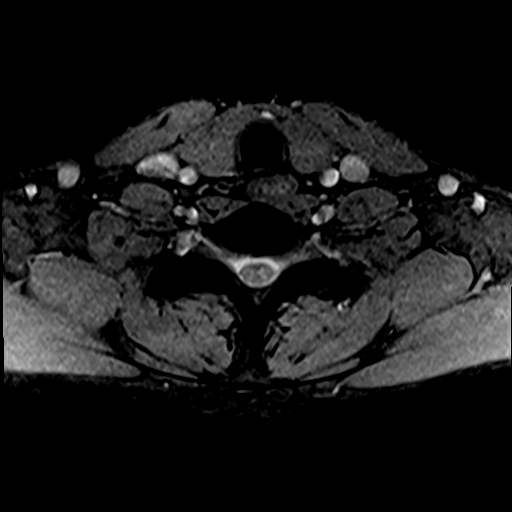
[im 6/29]
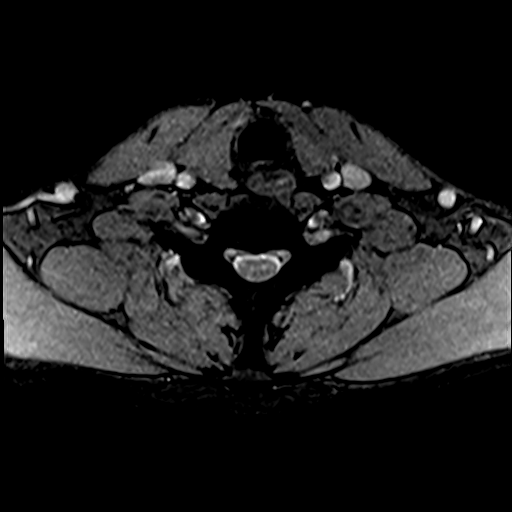
[im 9/29]
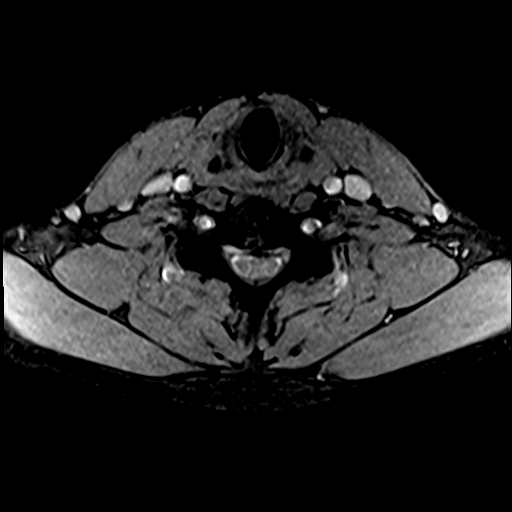
[im 13/29]
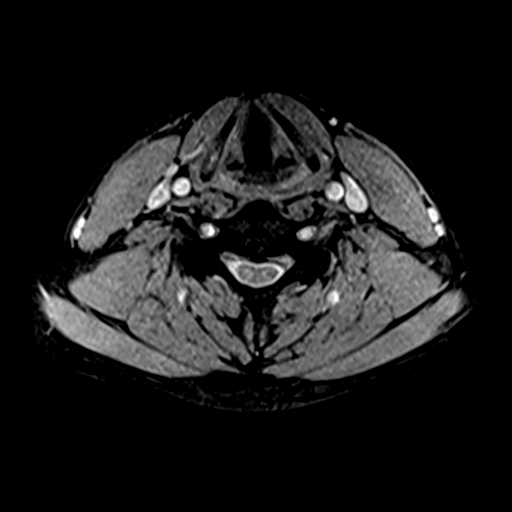
[im 15/29]
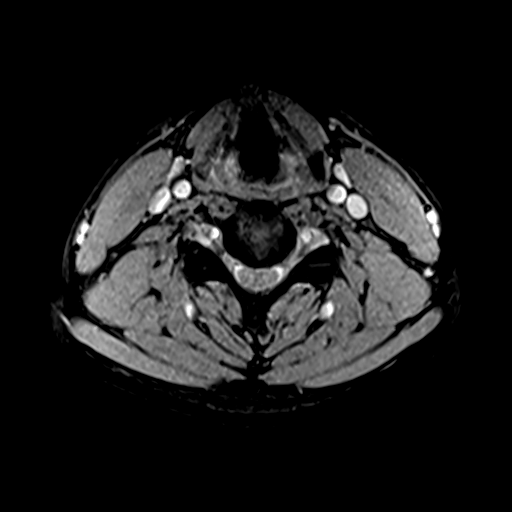
[im 16/29]
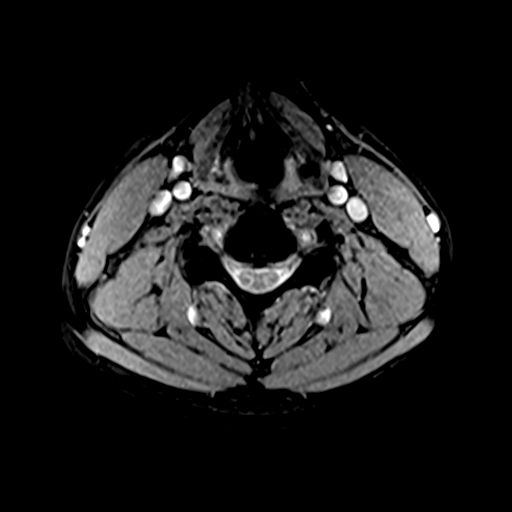
[im 25/29]
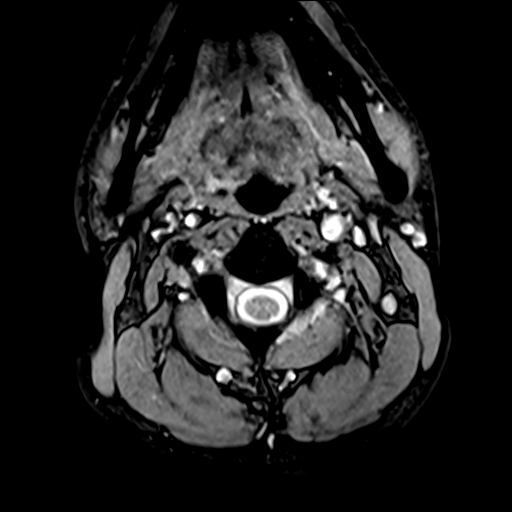

[35 of 48 positions shown; findings below may reference images not displayed]

FINDINGS: ALIGNMENT: Normal.

VERTEBRAL BODY HEIGHTS: Mild height loss and increased AP diameter of the C3-C6 vertebral bodies, most likely degenerative in etiology.

MARROW SIGNAL: Mild multilevel degenerative endplate signal changes, most pronounced at C5-6.

INTERVERTEBRAL DISCS: Multilevel desiccation. Mild height loss at C5-6.

PARASPINAL SOFT TISSUES: No paraspinal soft tissue signal abnormalities.

SPINAL CORD: Normal in signal. There is mild mass effect on the left spinal cord at C5-C6 described below.

VISUALIZED POSTERIOR FOSSA: Unremarkable.

Findings by level:

C2-C3:  No high-grade canal stenosis or foraminal narrowing.

C3-C4:  Uncovertebral and facet joint hypertrophy cause moderate right foraminal narrowing. No high-grade left foraminal narrowing or canal stenosis.

C4-C5:  Disc osteophyte complex and ligamentum flavum hypertrophy cause moderate canal stenosis without spinal cord compression. Uncovertebral and facet joint hypertrophy cause moderate right foraminal narrowing. No high-grade left foraminal narrowing.

C5-C6:  Disc osteophyte complex and ligamentum flavum hypertrophy cause moderate canal stenosis with mild mass effect on the left aspect of the spinal cord. No abnormal spinal cord signal. Uncovertebral and facet joint hypertrophy and possible left foraminal disc protrusion cause severe left foraminal narrowing. Uncovertebral and facet joint hypertrophy cause severe right foraminal narrowing.

C6-C7:  Disc osteophyte complex and ligamentum flavum hypertrophy cause moderate canal stenosis. No high-grade foraminal narrowing.

C7-T1:  No high-grade canal stenosis or foraminal narrowing.
IMPRESSION: 1.
Moderate right C3-4 foraminal narrowing.

2.
Moderate C4-5 canal stenosis.

3.
Moderate right C4-5 foraminal narrowing.

4.
Moderate C5-6 canal stenosis with mild mass effect on the spinal cord.

5.
Severe left and moderate right C5-6 foraminal narrowing.

6.
Moderate C6-7 canal stenosis.

## 2019-11-16 IMAGING — MR MRI TSPINE WO CONTRAST
4 of 6 series · 31 of 48 positions shown · non-contrast
Comparison: None.

HISTORY: 29-year-old male with back pain
TECHNIQUE: Multiplanar, multisequence MR images of the thoracic spine were obtained without intravenous contrast. Only sagittal scout images are provided. No coronal scout available at the time of dictation.

[Series 16: t2_sag · sagittal · 3.0mm · 0.89mm/px · 7 of 21 slices shown]
[im 1/21]
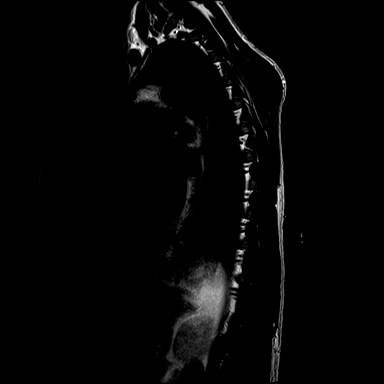
[im 4/21]
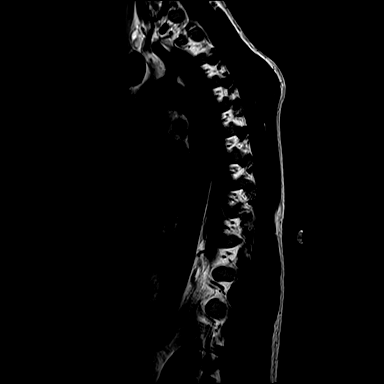
[im 7/21]
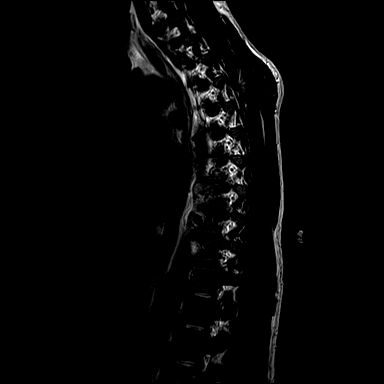
[im 11/21]
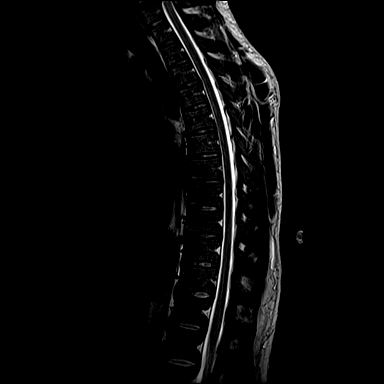
[im 14/21]
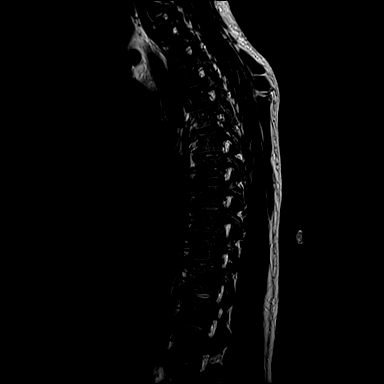
[im 17/21]
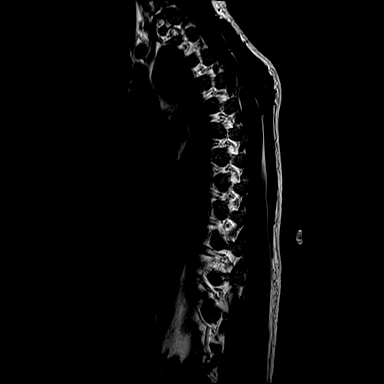
[im 21/21]
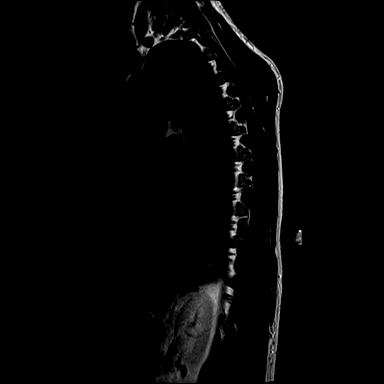

[Series 17: t1_sag · sagittal · 3.0mm · 0.89mm/px · 8 of 21 slices shown]
[im 1/21]
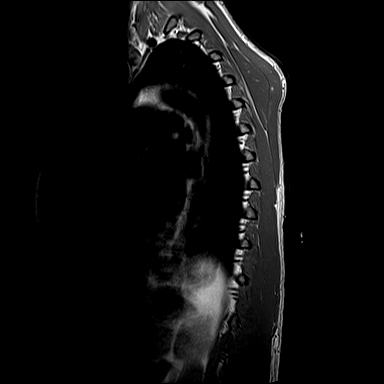
[im 3/21]
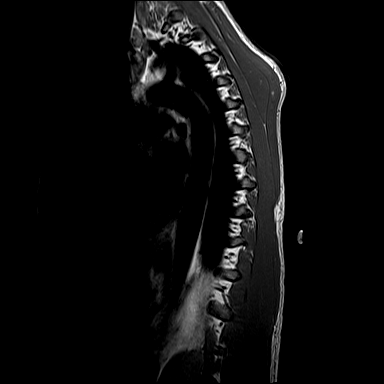
[im 6/21]
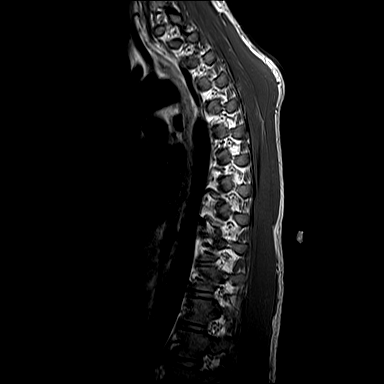
[im 9/21]
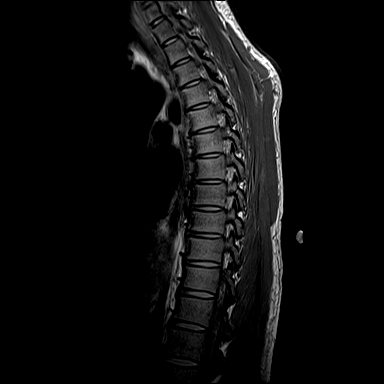
[im 12/21]
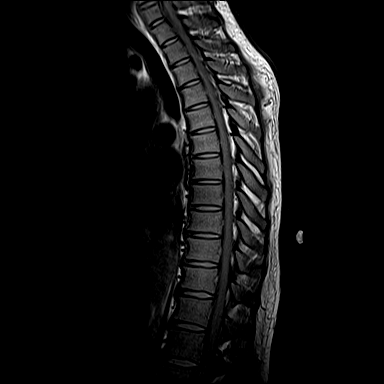
[im 15/21]
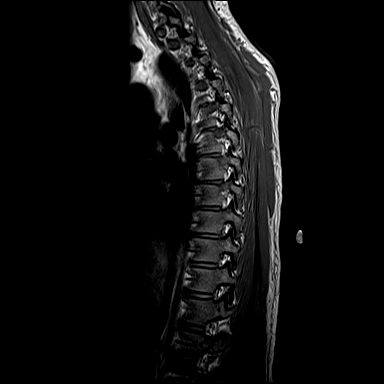
[im 18/21]
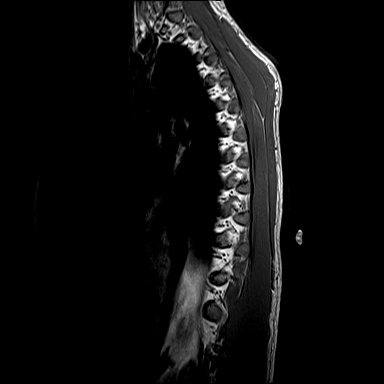
[im 21/21]
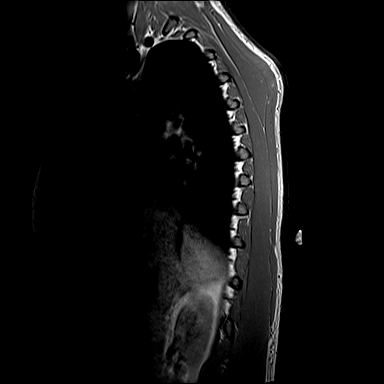

[Series 18: ir_sag · sagittal · 3.0mm · 0.66mm/px · 8 of 21 slices shown]
[im 1/21]
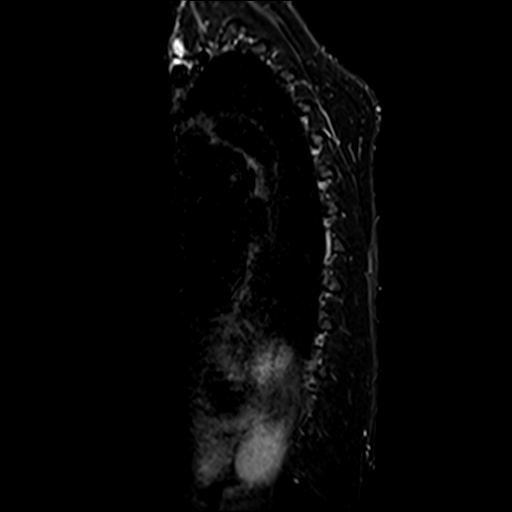
[im 3/21]
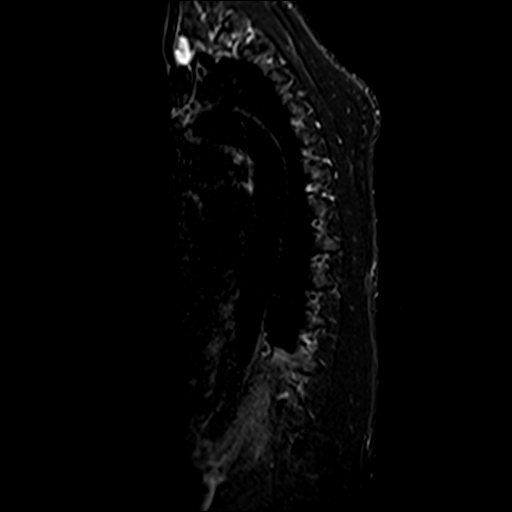
[im 6/21]
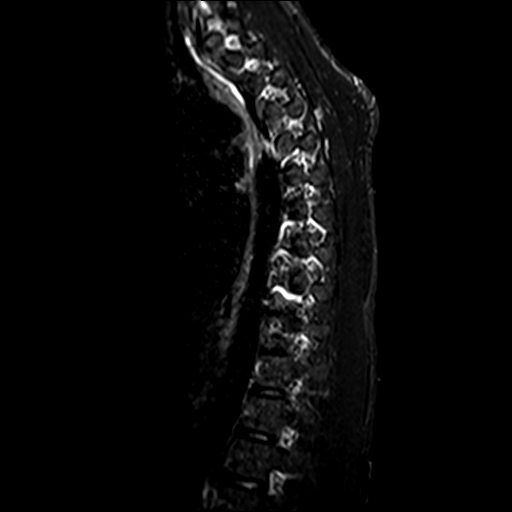
[im 9/21]
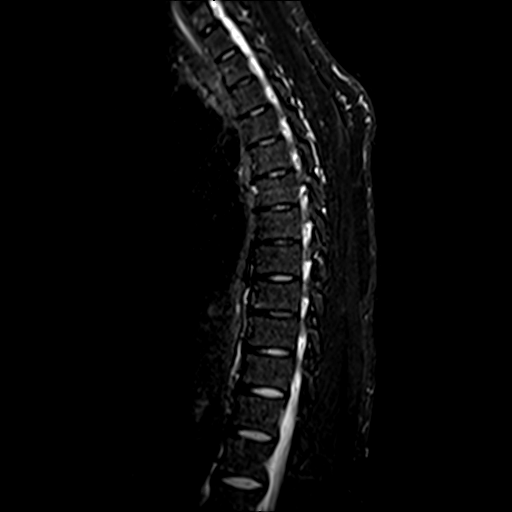
[im 12/21]
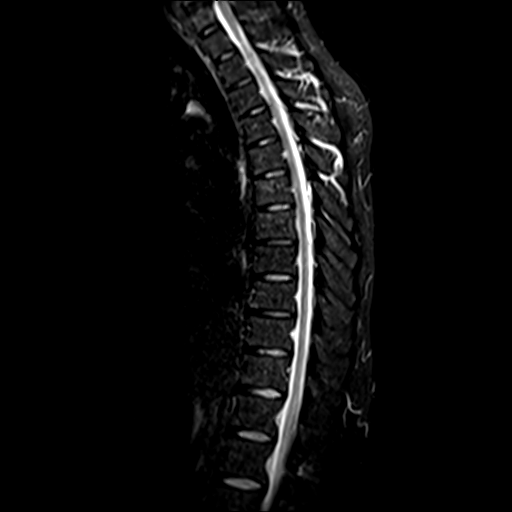
[im 15/21]
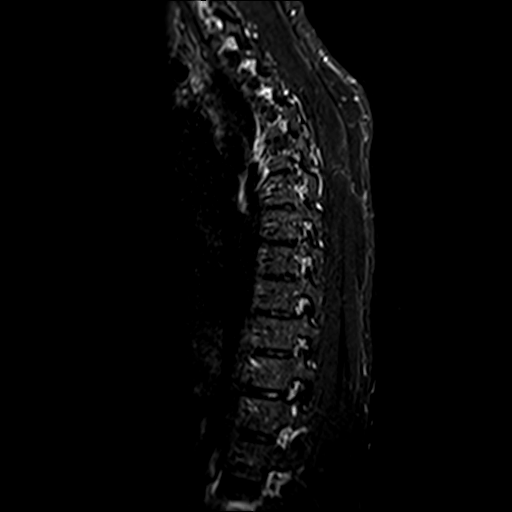
[im 18/21]
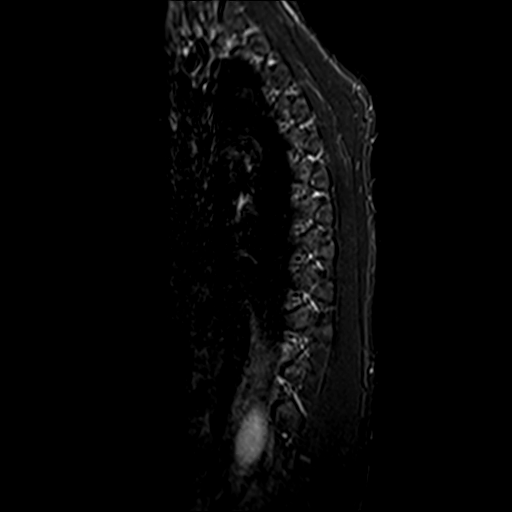
[im 21/21]
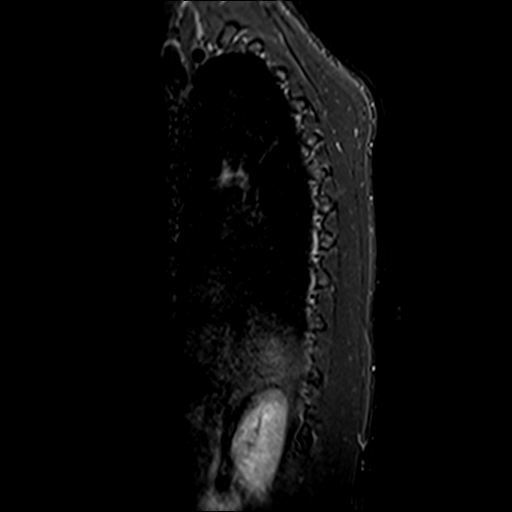

[Series 19: t2_axial · axial · 3.5mm · 0.62mm/px · z∈[-184,-62]mm · 8 of 28 slices shown]
[im 1/28]
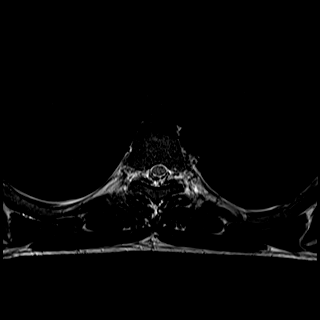
[im 4/28]
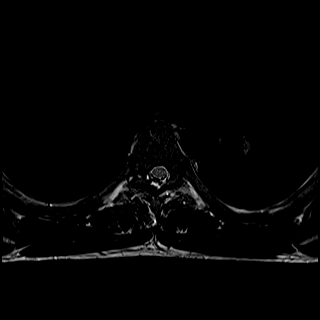
[im 10/28]
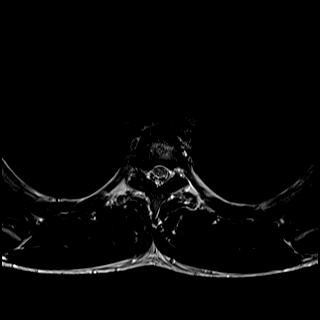
[im 13/28]
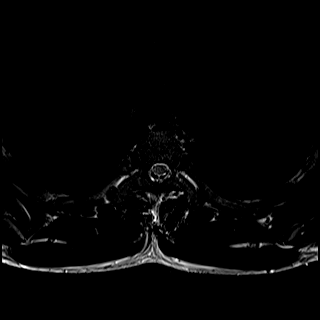
[im 16/28]
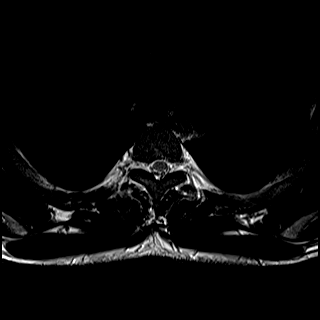
[im 19/28]
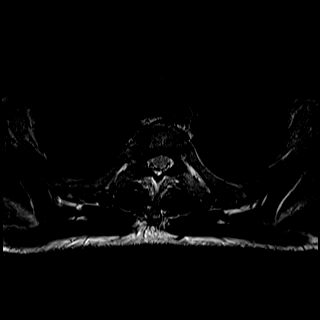
[im 25/28]
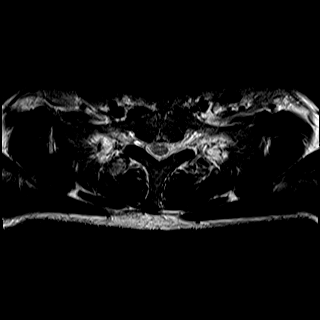
[im 28/28]
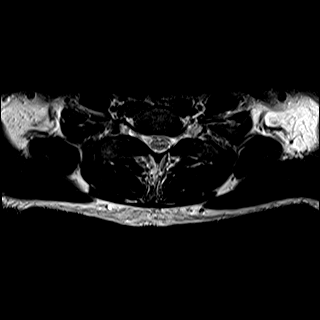

[31 of 48 positions shown; findings below may reference images not displayed]

FINDINGS: COUNT/LABELING: Please note that spinal labeling was performed assuming there are 12 rib-bearing thoracic vertebrae.

ALIGNMENT: Normal

VERTEBRAL BODY HEIGHTS: Maintained

MARROW SIGNAL: Preserved

INTERVERTEBRAL DISCS: Mild desiccation or height loss.

PARASPINAL SOFT TISSUES: No paraspinal soft tissue signal abnormalities.

SPINAL CORD: Normal in signal and caliber.

FINDINGS BY LEVEL:

No high-grade canal stenosis or foraminal narrowing in the thoracic spine.
IMPRESSION: Normal thoracic spine MRI.

## 2020-03-05 IMAGING — CR C-SPINE 4 or 5 views
5 series · 5 of 5 positions shown · non-contrast
Comparison: Correlation is made to previous MRI of the cervical spine November 16, 2019.

INDICATION: Cervical pain with history of previous neck surgery 02/16/2020.
TECHNIQUE: Cervical spine 5 views including flexion and extension views.

[left lateral (1 of 3)]
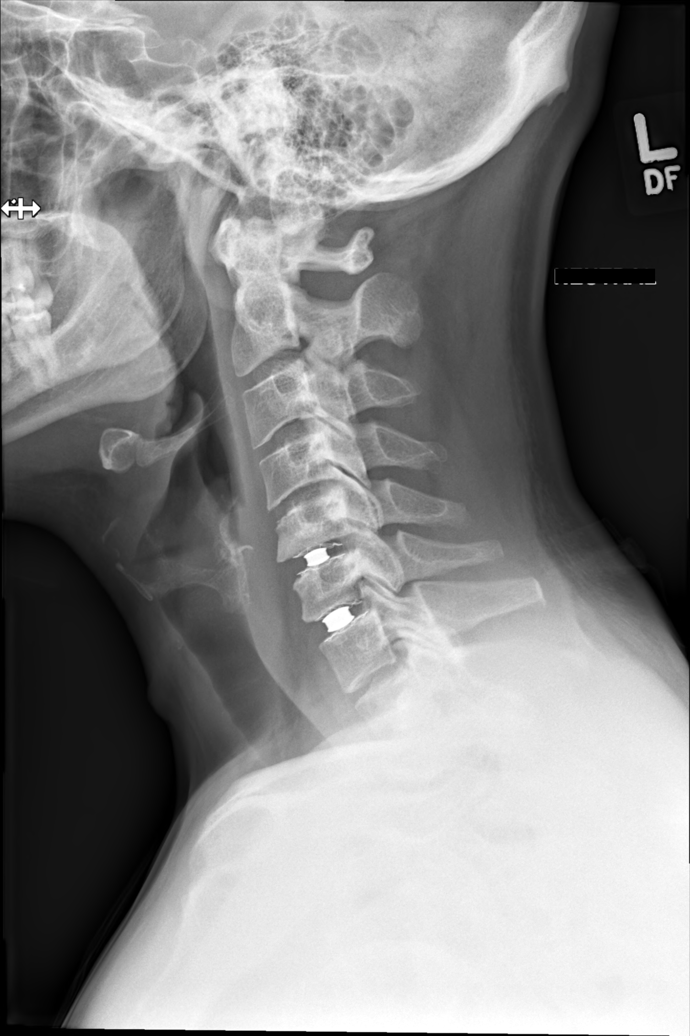

[left lateral (2 of 3)]
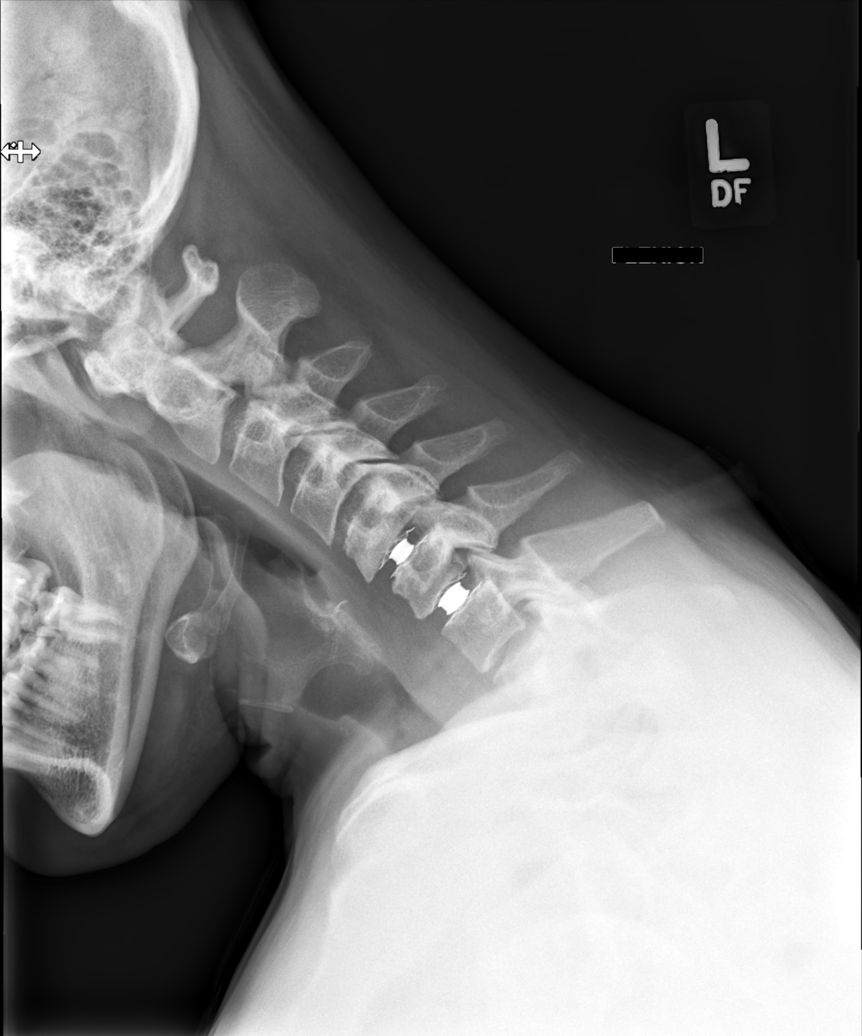

[left lateral (3 of 3)]
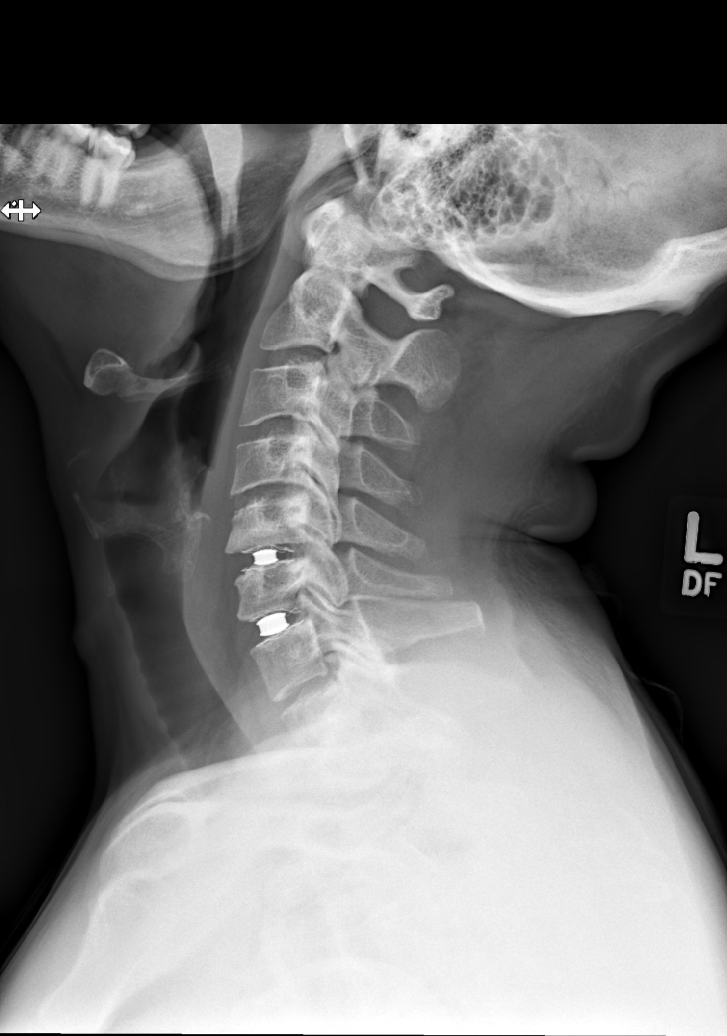

[AP (1 of 2)]
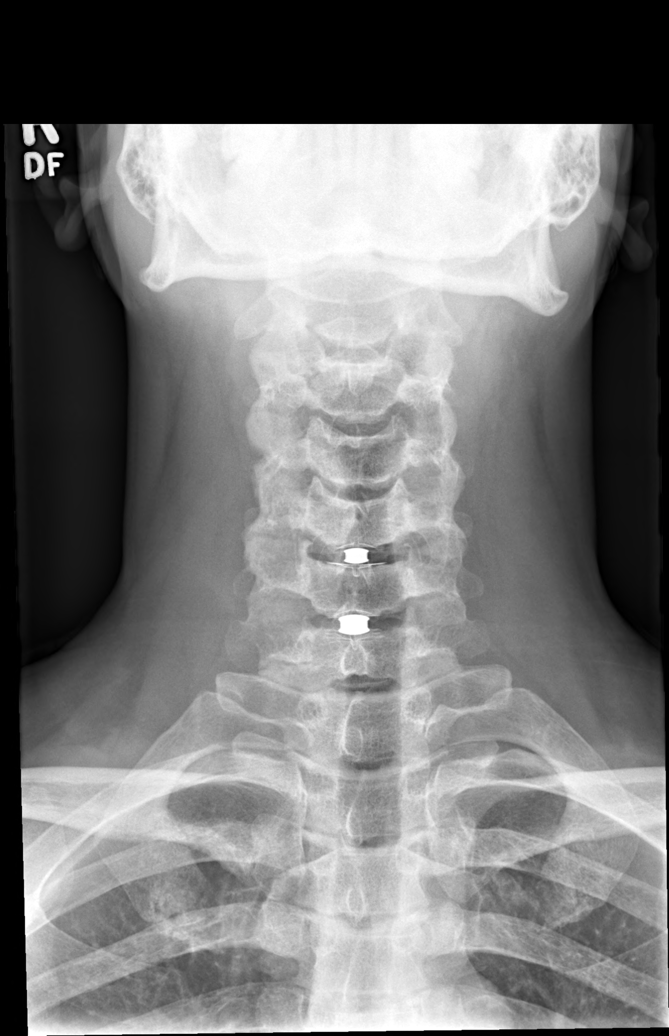

[AP (2 of 2)]
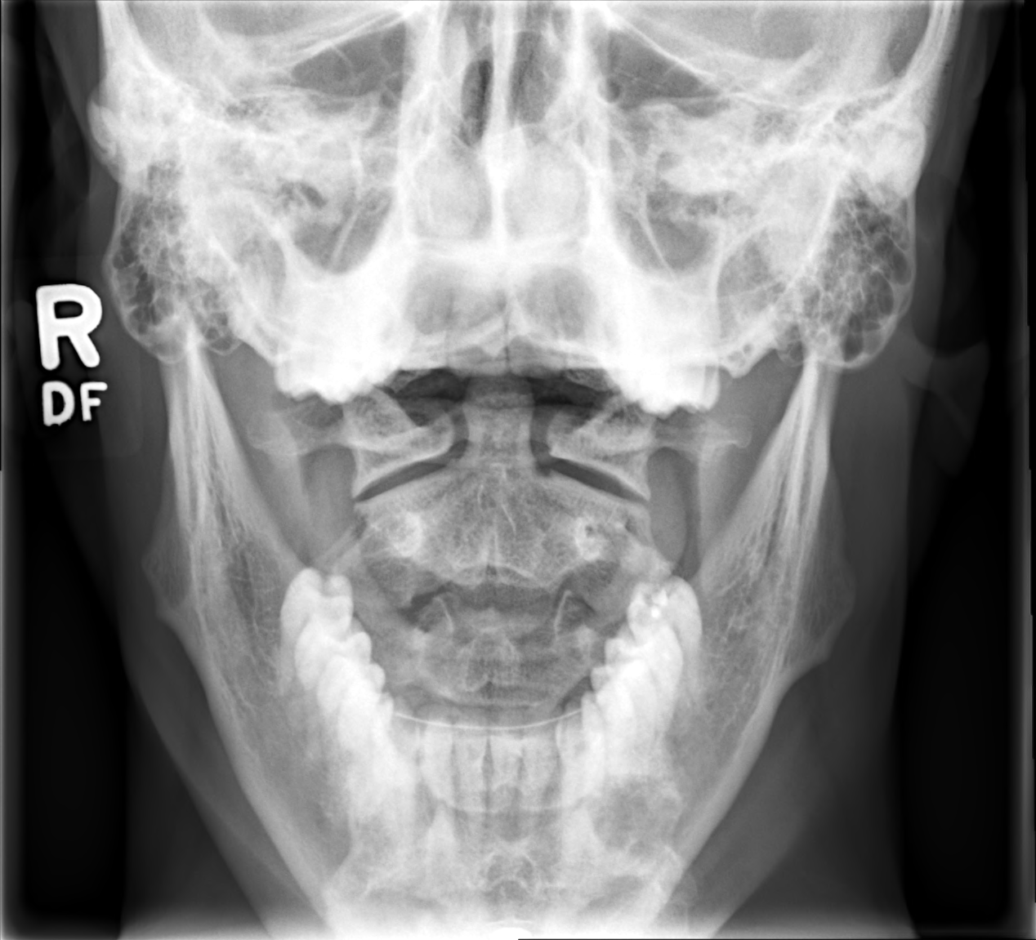

[5 of 5 positions shown; findings below may reference images not displayed]

FINDINGS: There is straightening of the normal cervical lordosis which may be positional or due to muscle spasm. There is status post C5-C6 and C6-C7 artificial intervertebral disc replacement in good position in neutral, flexion and extension views. Mild degenerative changes of the lumbar facets are seen at C6-7 and C7-T1.
IMPRESSION: Status post C5-C6 and C6-C7 artificial intervertebral disc replacement without radiographic evidence of complication.

## 2020-08-20 IMAGING — MR MRI SHOULDER LT WO CONTRAST
5 of 6 series · 31 of 40 positions shown · non-contrast
Comparison: None available.

INDICATION: Left shoulder pain and weakness, chronic, status post old injury 2932.
TECHNIQUE: Multiplanar, multisequence MR imaging of the left shoulder without contrast.

[Series 5: t2_axial_fs · axial · left · 3.0mm · 0.50mm/px · z∈[-27,+64]mm · 5 of 24 slices shown]
[im 1/24]
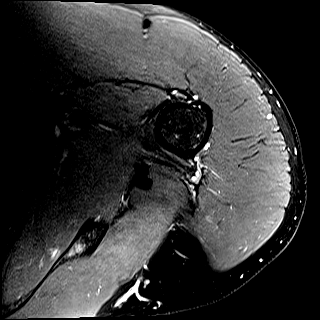
[im 6/24]
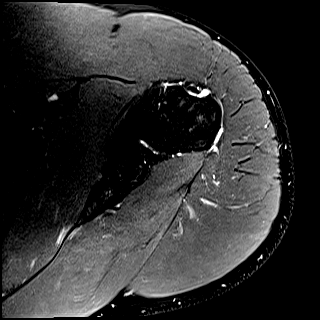
[im 12/24]
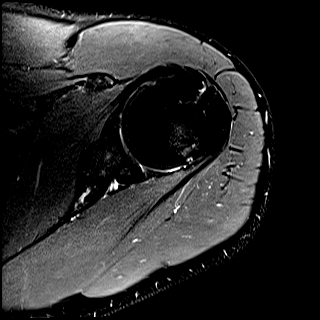
[im 18/24]
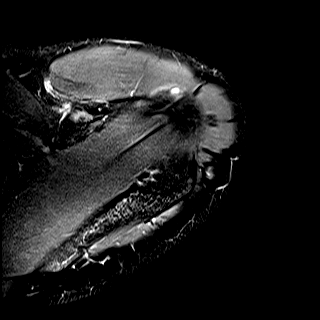
[im 24/24]
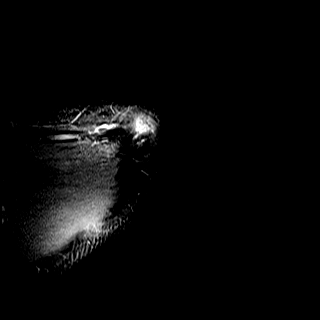

[Series 6: t2_cor_obl_fs · oblique · left · 3.0mm · 0.44mm/px · 5 of 19 slices shown]
[im 1/19]
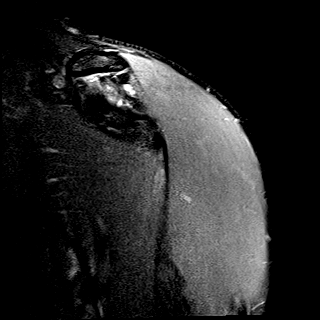
[im 5/19]
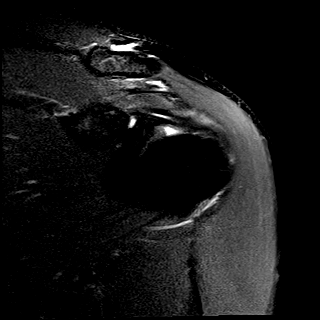
[im 10/19]
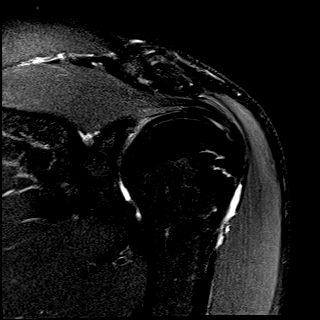
[im 14/19]
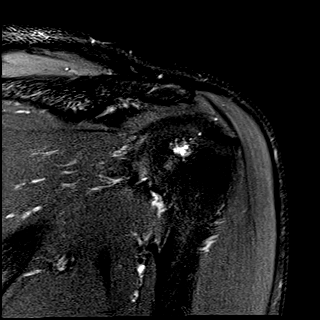
[im 19/19]
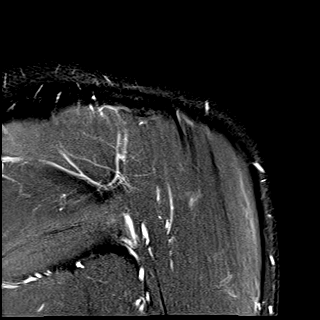

[Series 7: t2_sag_obl_fs · oblique · left · 3.0mm · 0.44mm/px · 7 of 28 slices shown]
[im 1/28]
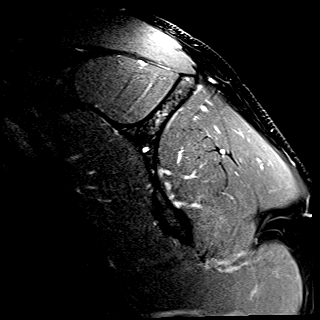
[im 5/28]
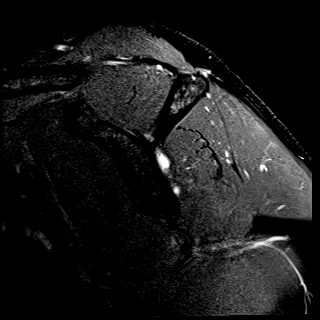
[im 10/28]
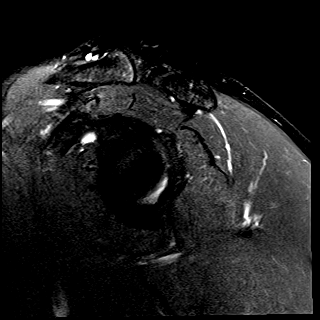
[im 14/28]
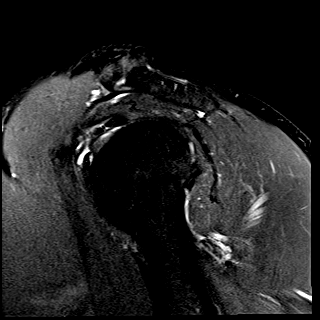
[im 19/28]
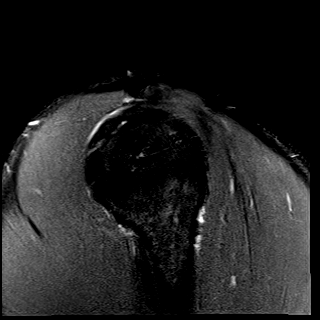
[im 23/28]
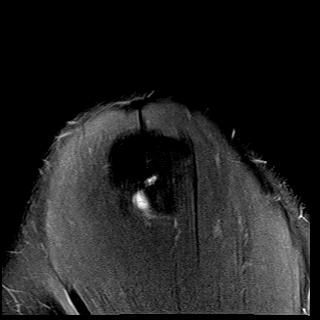
[im 28/28]
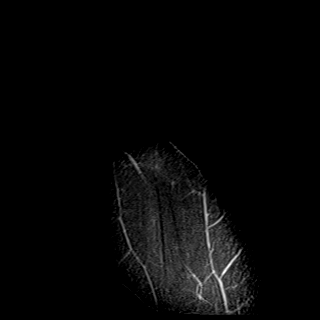

[Series 8: t1_sag_obl · oblique · left · 3.0mm · 0.36mm/px · 7 of 28 slices shown]
[im 1/28]
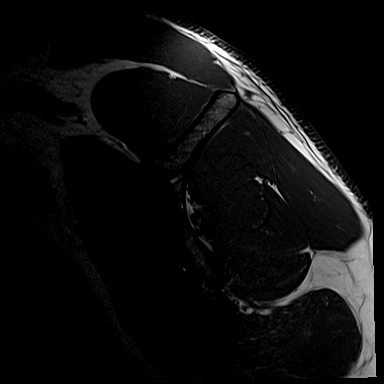
[im 5/28]
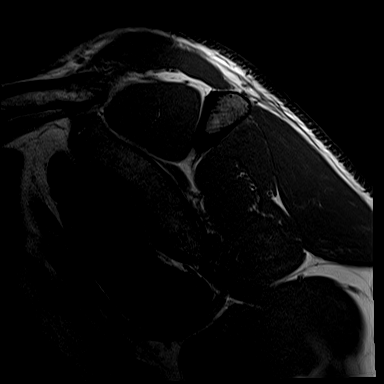
[im 10/28]
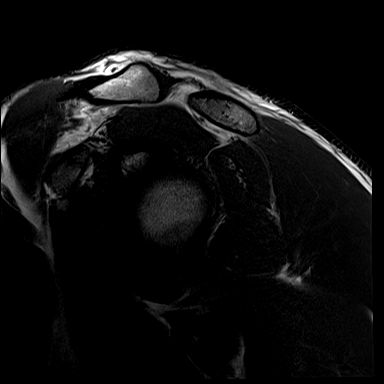
[im 14/28]
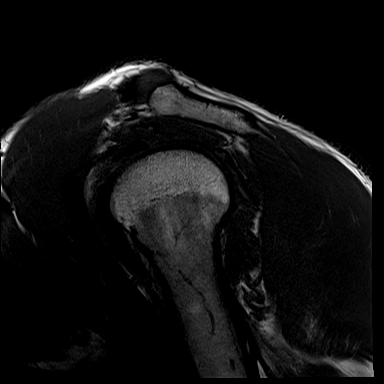
[im 19/28]
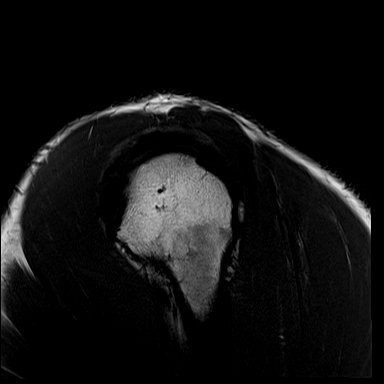
[im 23/28]
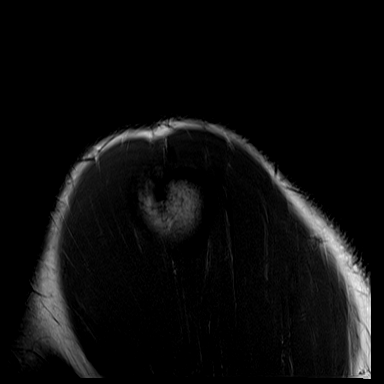
[im 28/28]
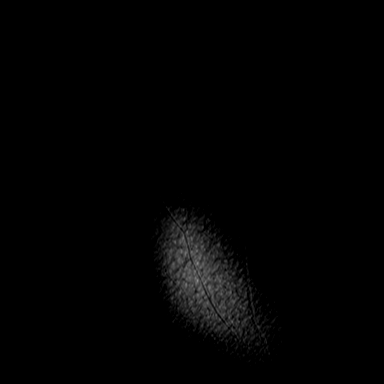

[Series 9: t2_sag_fs(blade) · oblique · left · 3.0mm · 0.44mm/px · 7 of 28 slices shown]
[im 1/28]
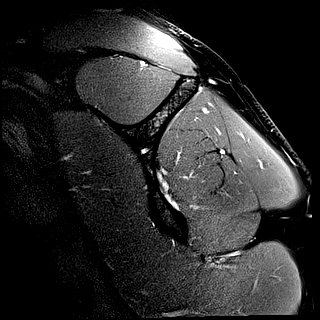
[im 5/28]
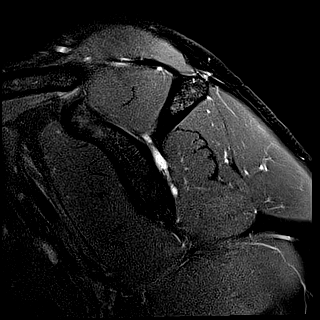
[im 10/28]
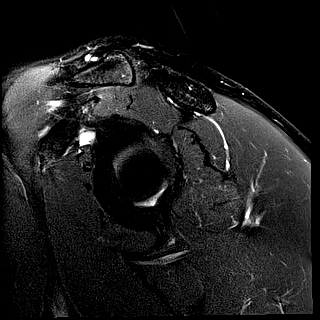
[im 14/28]
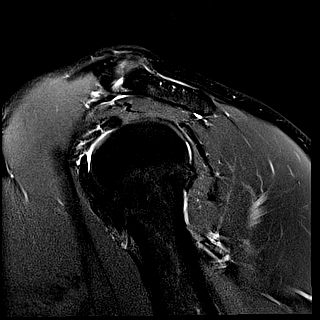
[im 19/28]
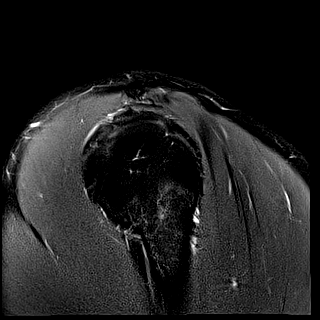
[im 23/28]
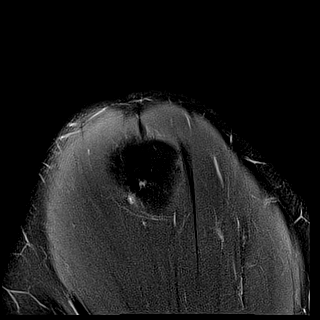
[im 28/28]
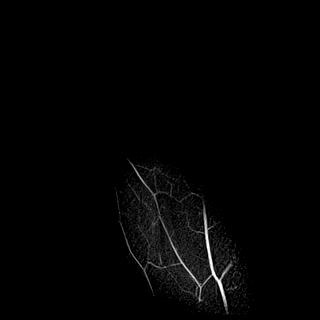

[31 of 40 positions shown; findings below may reference images not displayed]

FINDINGS: OSSEOUS: No acute fracture, avascular necrosis or aggressive osseous lesion. 

ACROMIAL OUTLET: Mild acromioclavicular joint osteoarthritis. No significant fluid is present within the subacromial/subdeltoid bursa. The acromion is nonhooked. The coracoclavicular and coracoacromial ligaments are intact.

ROTATOR CUFF:

Supraspinatus and infraspinatus: Intact.

Teres minor: Intact.

Subscapularis: Intact.

Fatty atrophy: The rotator cuff muscles are maintained.

LABRUM: The labrum is intact without fluid-filled labral cleft or paralabral cyst.

BICEPS TENDON: Mild proximal intra-articular long head biceps tendinosis. The proximal extra-articular long biceps tendon is intact and normally located.

GLENOHUMERAL JOINT: The glenohumeral articular cartilage is maintained. No focal chondral defect is identified. The glenohumeral joint is normal in alignment.

OTHER: The inferior glenohumeral ligament is unremarkable. No glenohumeral joint effusion.
IMPRESSION: 1.
Intact rotator cuff and labrum.

2.
Mild proximal intra-articular long head biceps tendinosis.

3.
Mild acromioclavicular joint osteoarthritis.

## 2020-08-20 IMAGING — CR C-SPINE W/ Flex Extend 6 or more views
1 series · 8 of 8 positions shown · non-contrast
Comparison: March 05, 2020.

REASON FOR EXAM: Postoperative cervical spine

[Series 1: lat · 0.17mm/px · 8 of 8 slices shown]
[im 1/8]
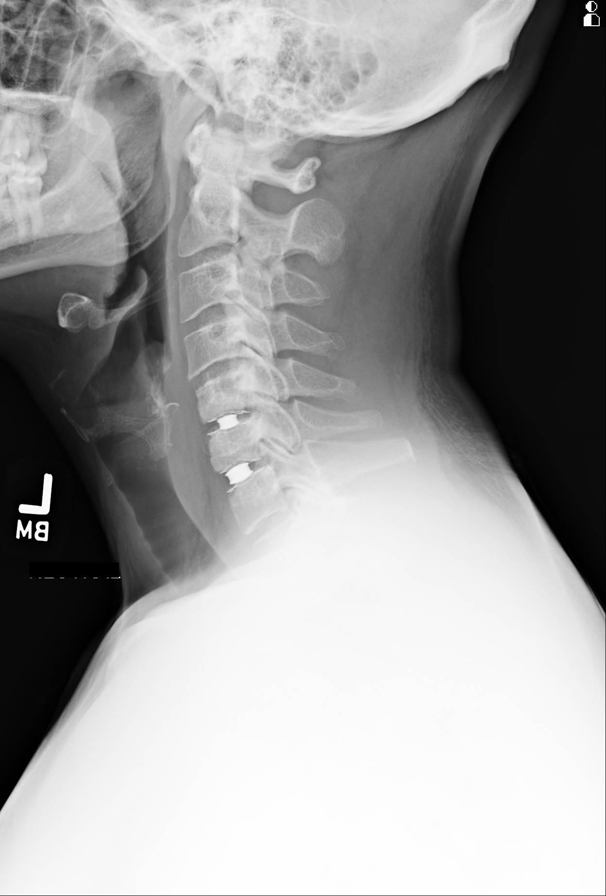
[im 2/8]
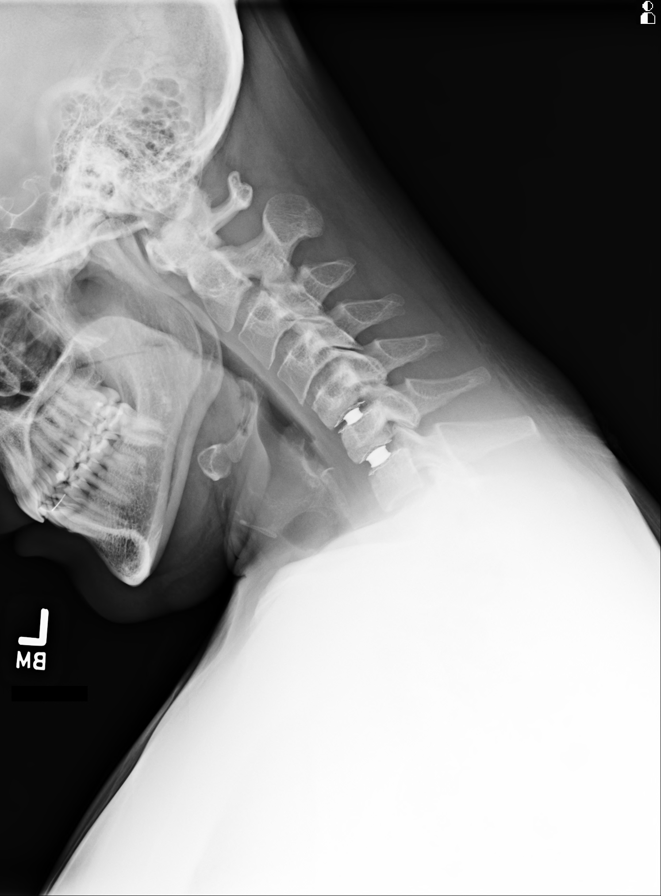
[im 3/8]
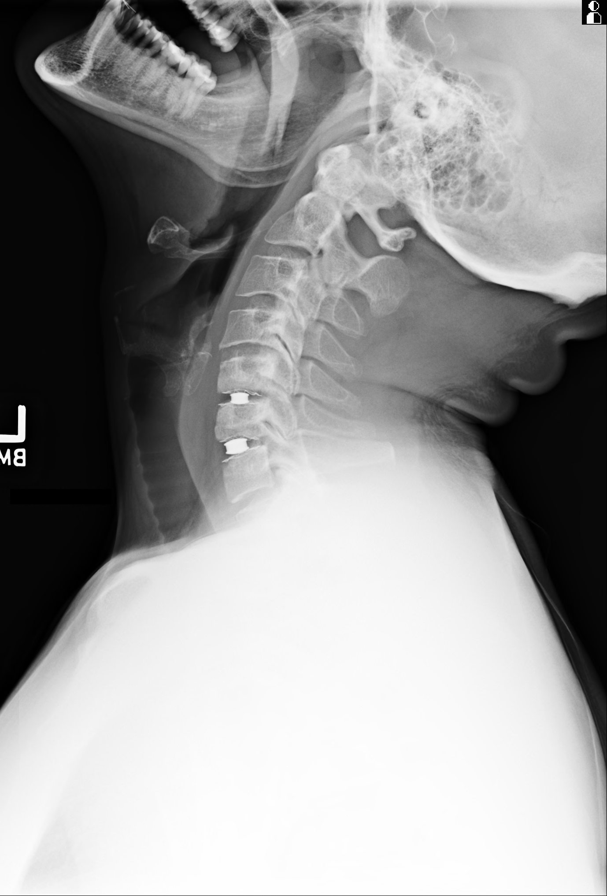
[im 4/8]
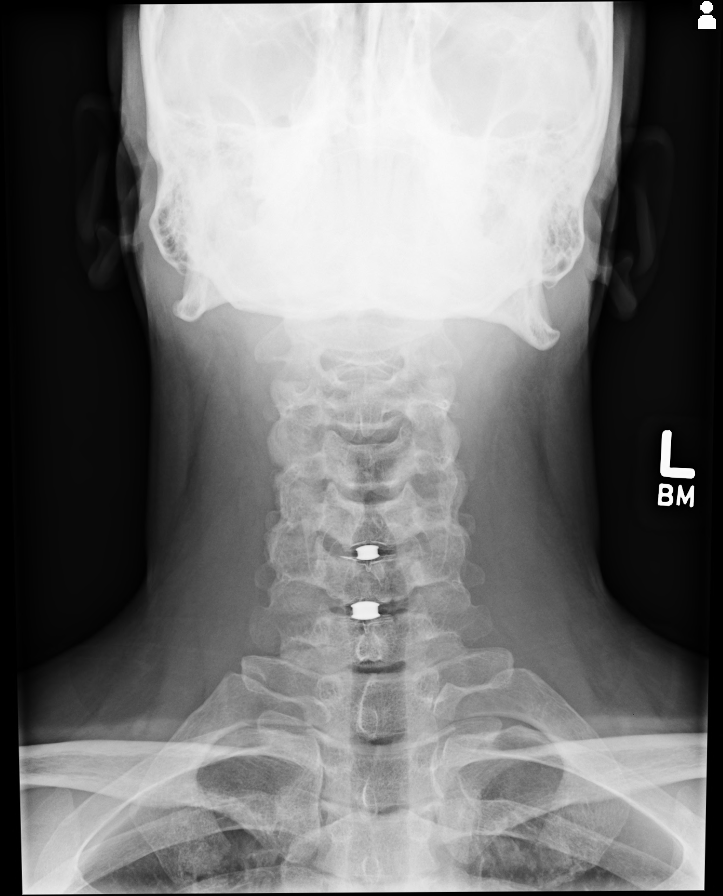
[im 5/8]
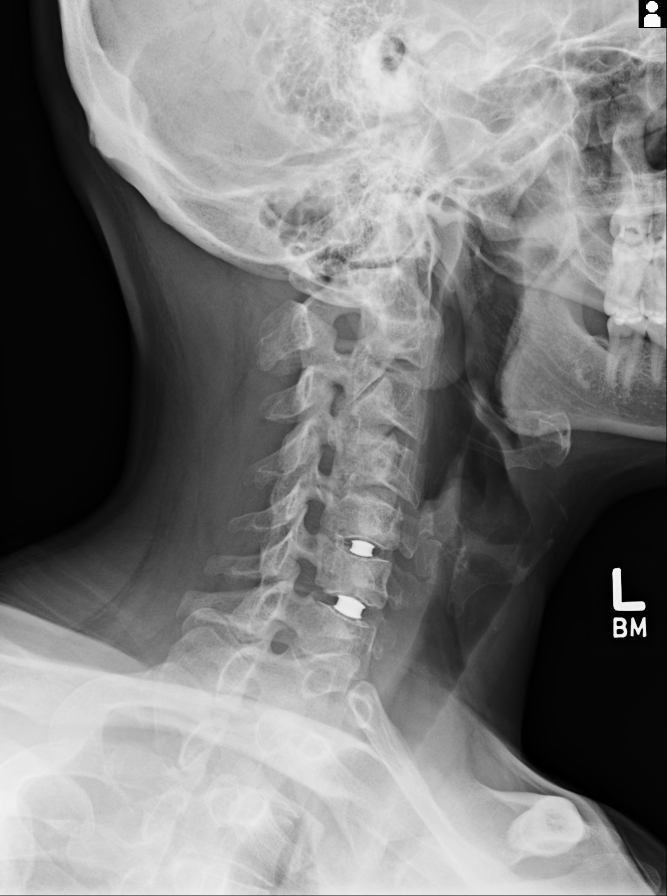
[im 6/8]
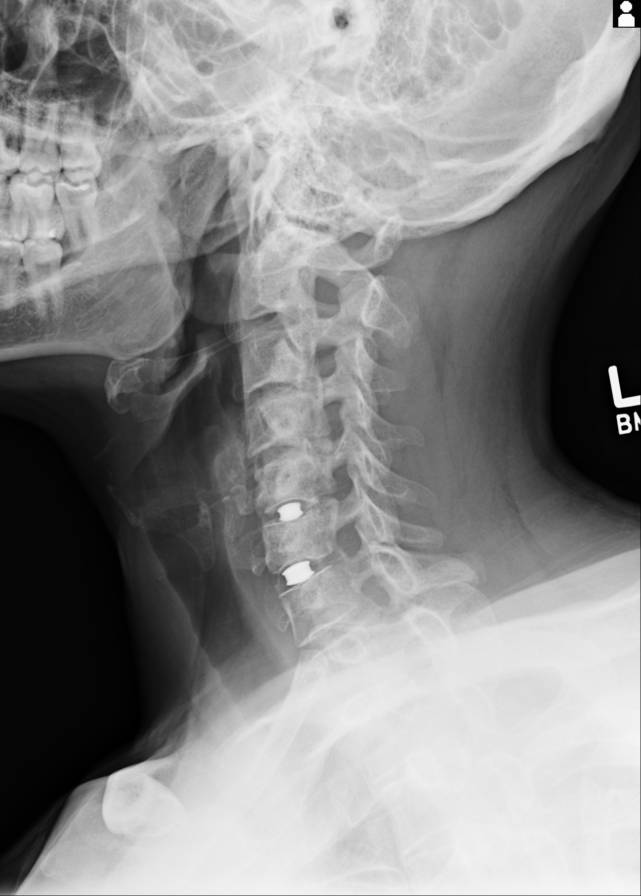
[im 7/8]
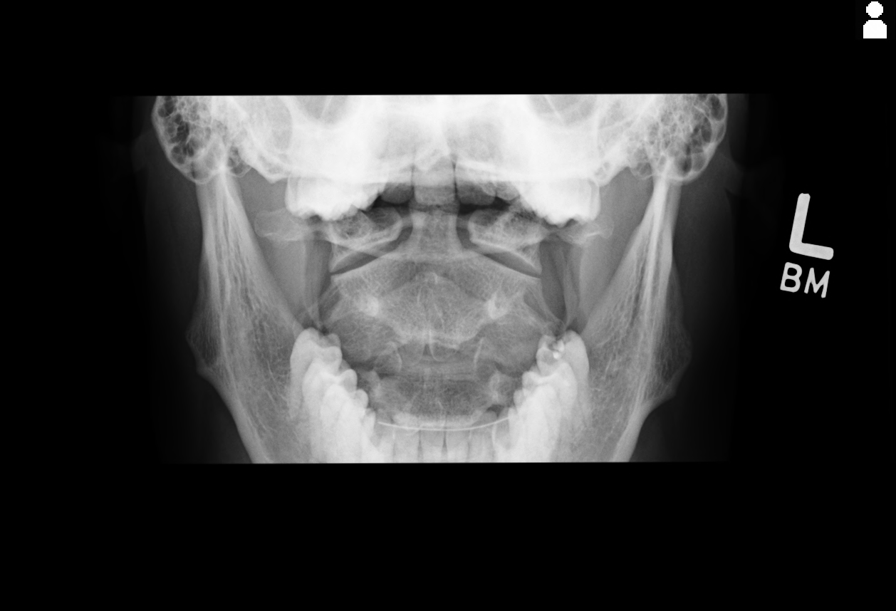
[im 8/8]
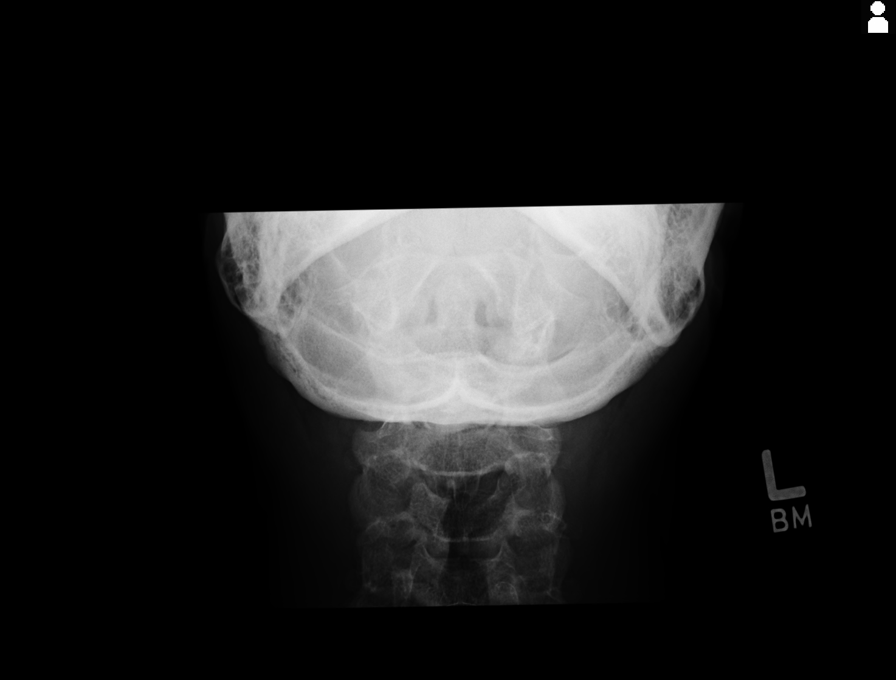

[8 of 8 positions shown; findings below may reference images not displayed]

TECHNIQUE AND FINDINGS: 7 views of the cervical spine show normal height and alignment of the vertebral bodies. Postsurgical changes with disc spacer material has been placed at C5-C6 and C6-C7. The position of the hardware is unchanged from previous exam. With flexion and extension views there is no evidence of instability. The facets are in good alignment. On the oblique views, there is no significant osseous encroachment upon neural foramina. Prevertebral soft tissues are normal. Base of dens is intact.
IMPRESSION: Expected postsurgical changes.

## 2020-09-27 IMAGING — CR L-SPINE 2-3 VWS
1 series · 3 of 3 positions shown · non-contrast
Comparison: Lumbar spine MRI 11/15/2019

Lumbar spine radiographs, 3 views
INDICATION: Low back pain, encounter for disability determination

[Series 1: AP · 0.17mm/px · 3 of 3 slices shown]
[im 1/3]
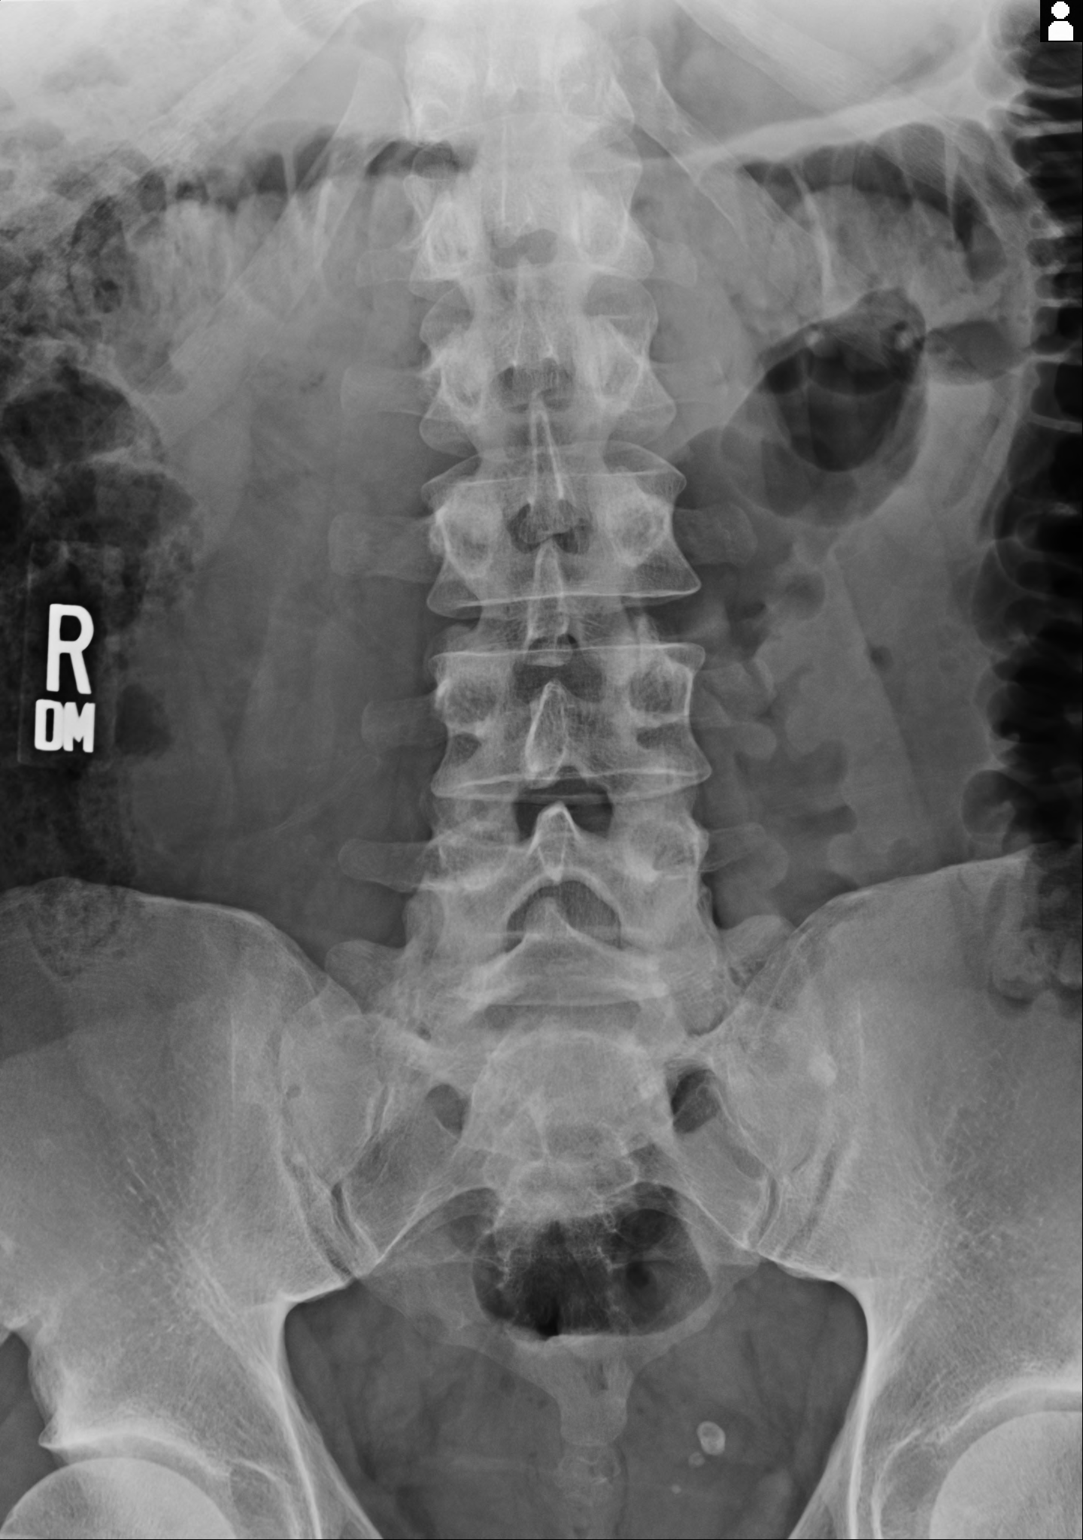
[im 2/3]
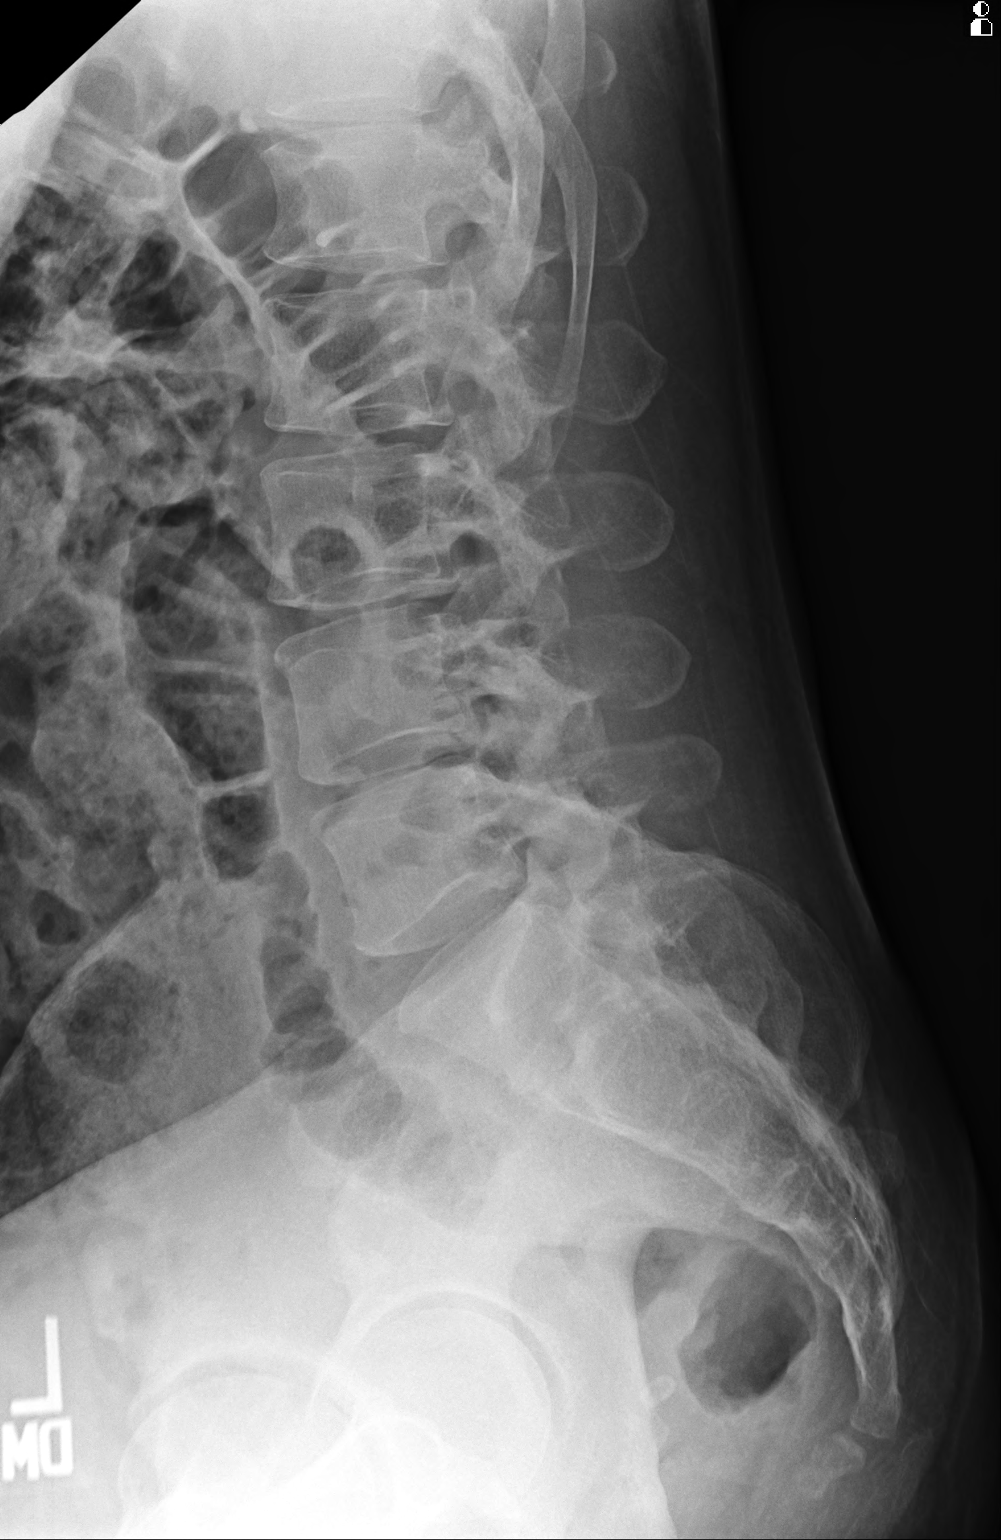
[im 3/3]
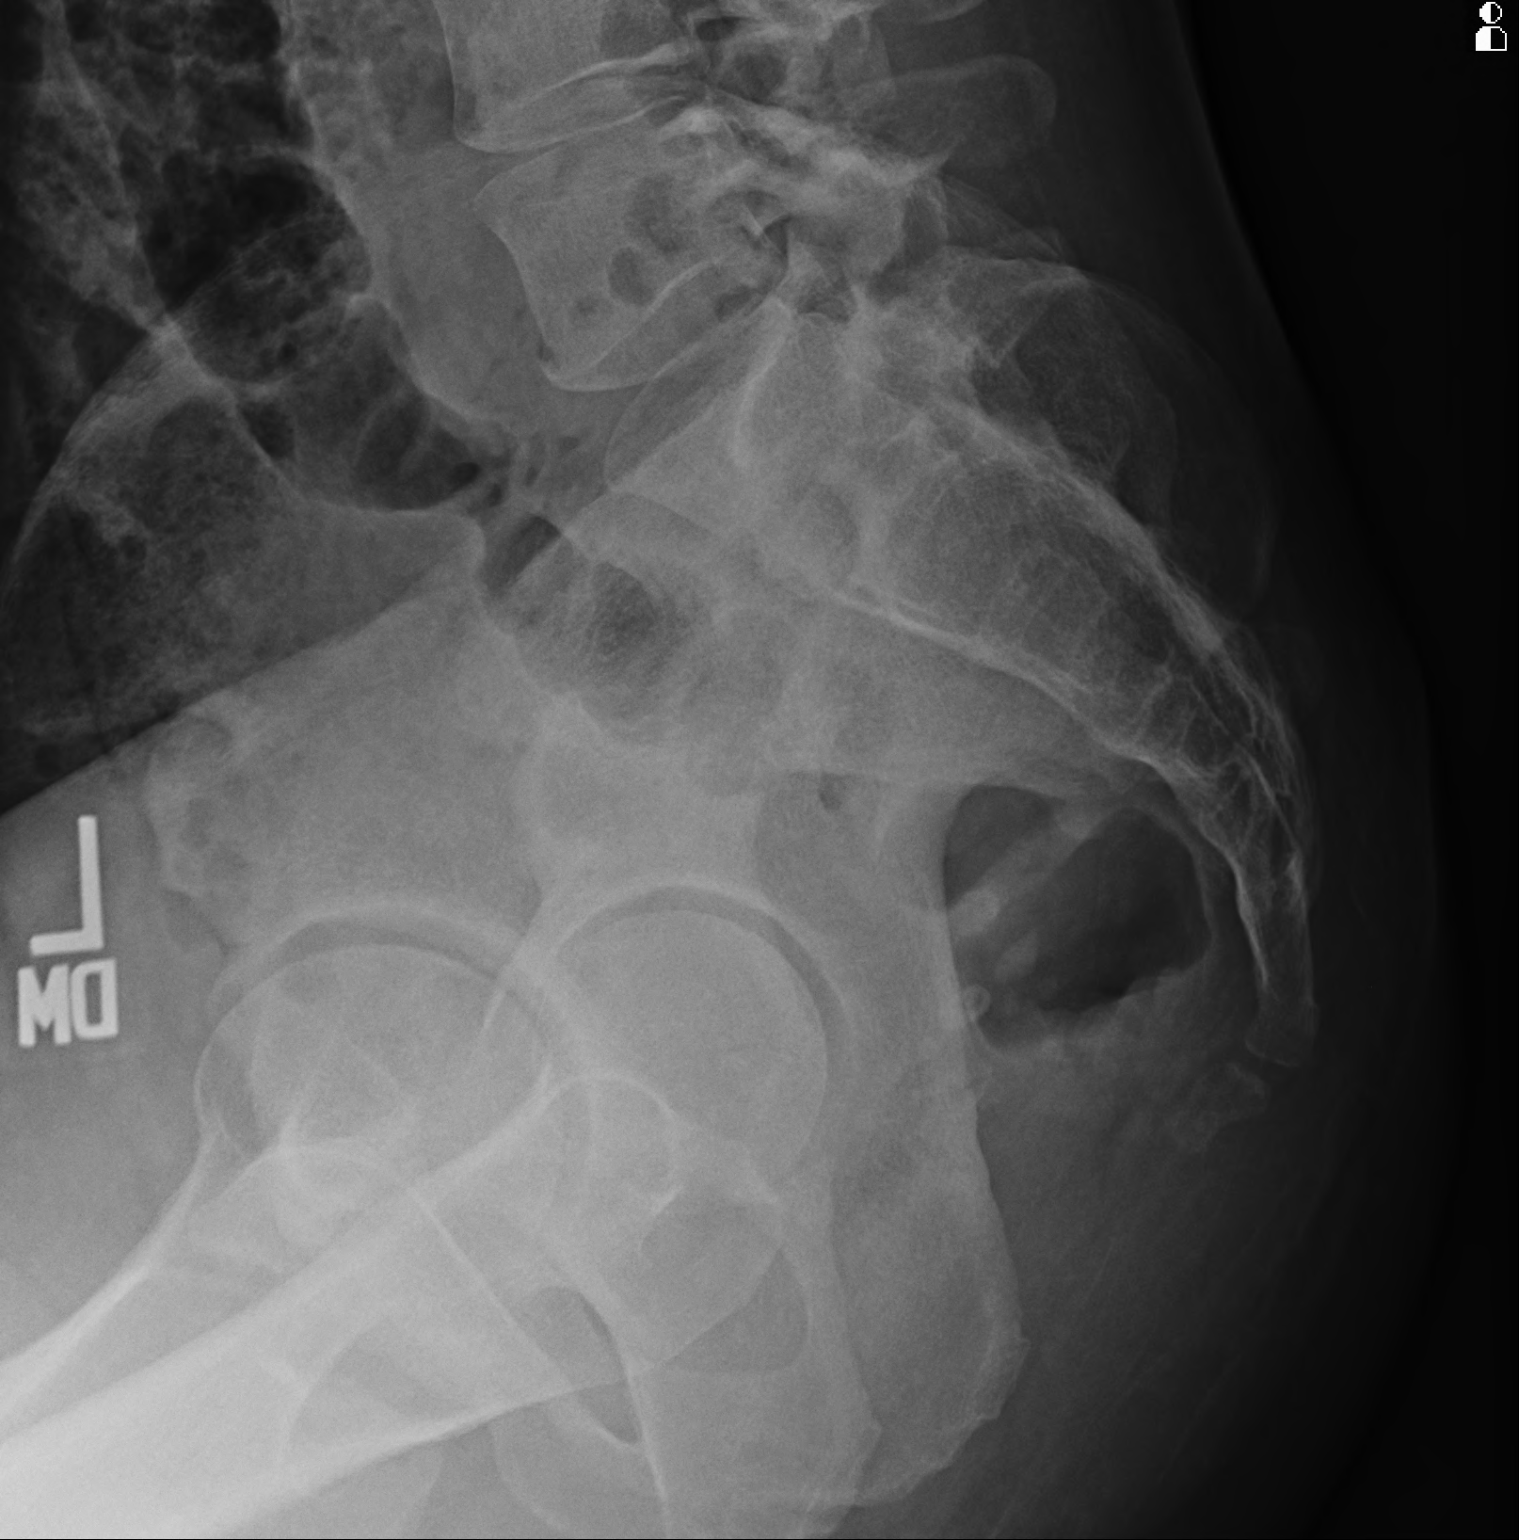

[3 of 3 positions shown; findings below may reference images not displayed]

FINDINGS: No acute fracture. No pars defect. Disc heights are intact. SI joints are intact.
IMPRESSION: Unremarkable lumbar spine radiographs.

## 2020-12-26 IMAGING — MR MRI CSPINE WO CONTRAST
5 series · 37 of 48 positions shown · IV contrast (agent unspecified)
Comparison: Cervical spine MRI 11/16/2019

HISTORY: 30-year-old male. Radiculopathy, cervical region
TECHNIQUE: Multiplanar, multisequence MR images of the cervical spine were obtained without intravenous contrast. 

CONTRAST: None.

[Series 5: t2_sag · sagittal · 3.0mm · 0.76mm/px · 8 of 17 slices shown]
[im 1/17]
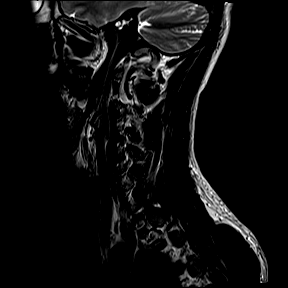
[im 3/17]
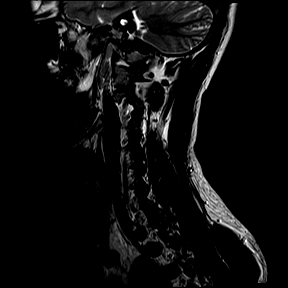
[im 5/17]
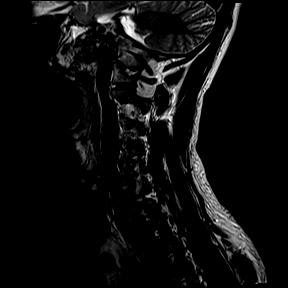
[im 7/17]
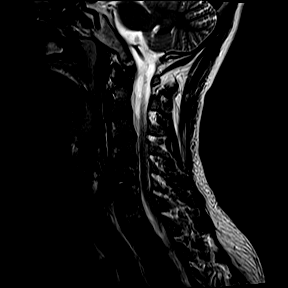
[im 10/17]
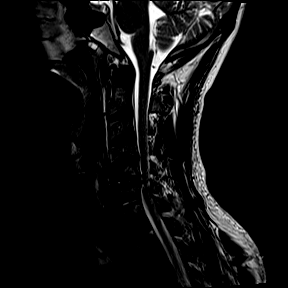
[im 12/17]
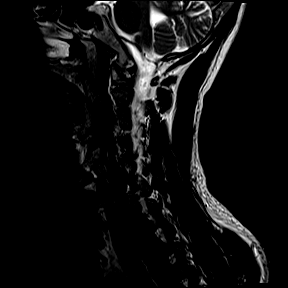
[im 14/17]
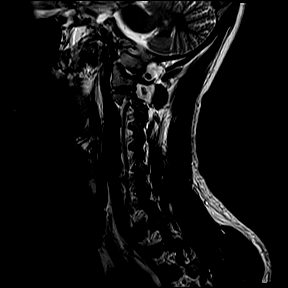
[im 17/17]
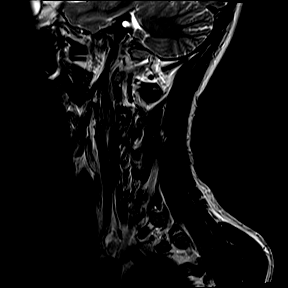

[Series 6: t1_sag · sagittal · 3.0mm · 0.69mm/px · 7 of 17 slices shown]
[im 1/17]
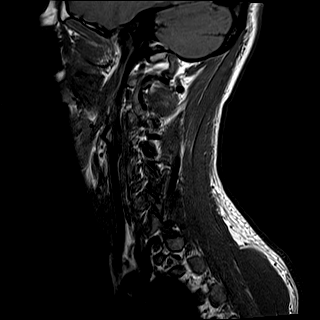
[im 3/17]
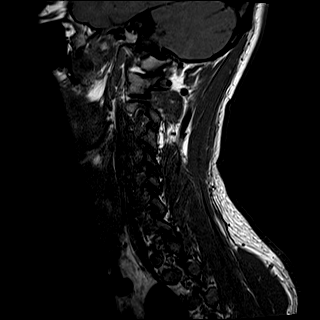
[im 6/17]
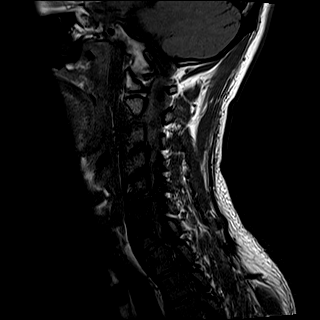
[im 9/17]
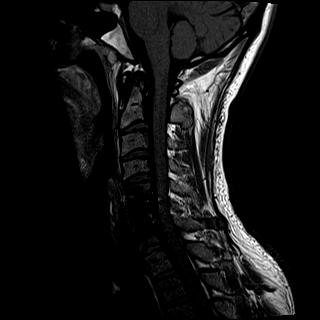
[im 11/17]
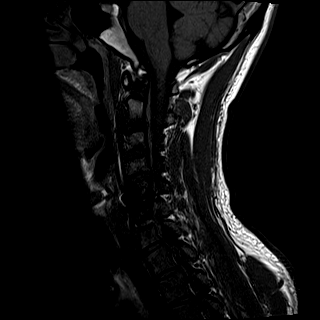
[im 14/17]
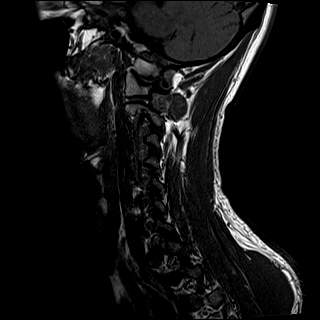
[im 17/17]
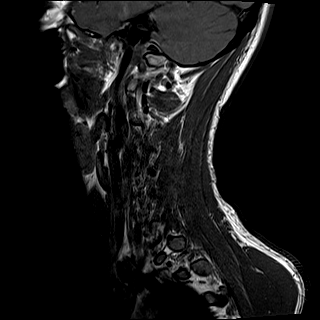

[Series 7: ir_sag · sagittal · 3.0mm · 0.86mm/px · 7 of 17 slices shown]
[im 1/17]
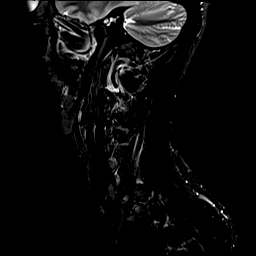
[im 3/17]
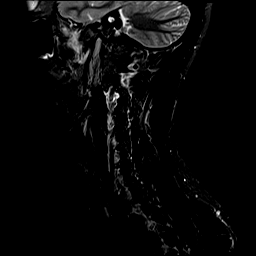
[im 6/17]
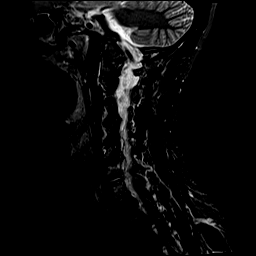
[im 9/17]
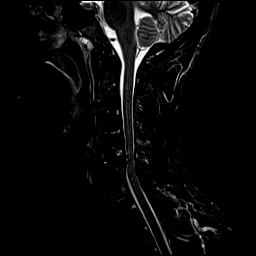
[im 11/17]
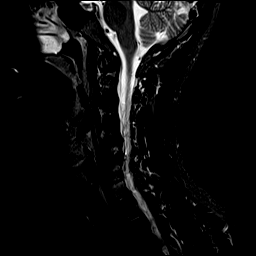
[im 14/17]
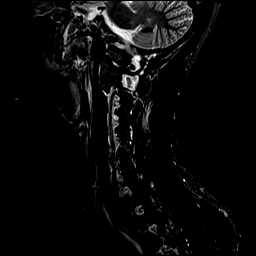
[im 17/17]
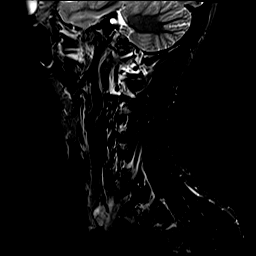

[Series 8: t2_medic_axial · axial · 3.0mm · 0.35mm/px · z∈[-78,+32]mm · 9 of 29 slices shown]
[im 1/29]
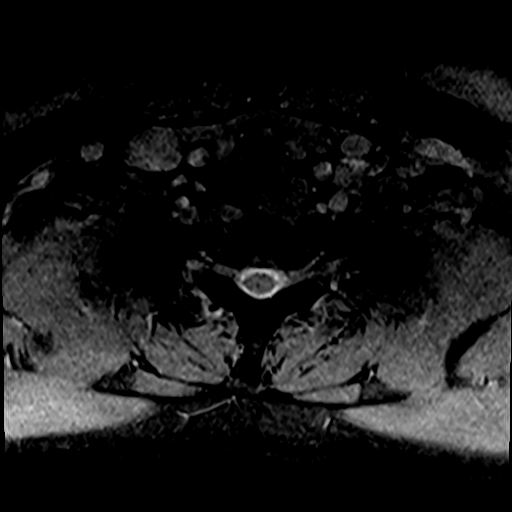
[im 5/29]
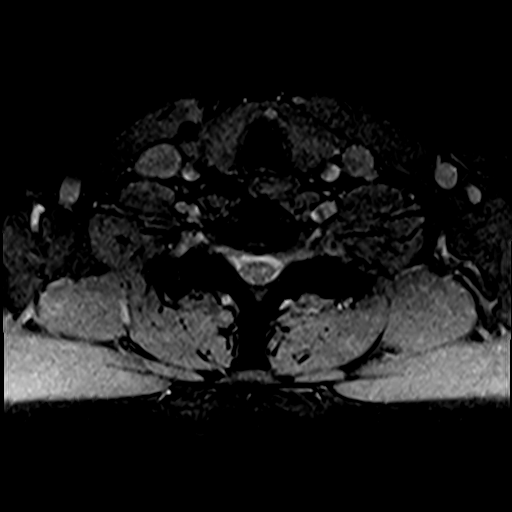
[im 10/29]
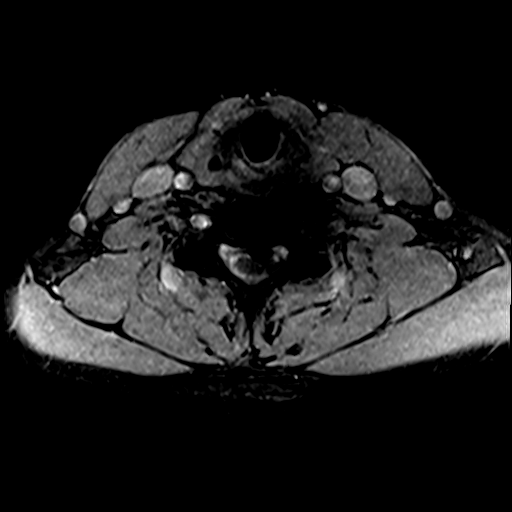
[im 12/29]
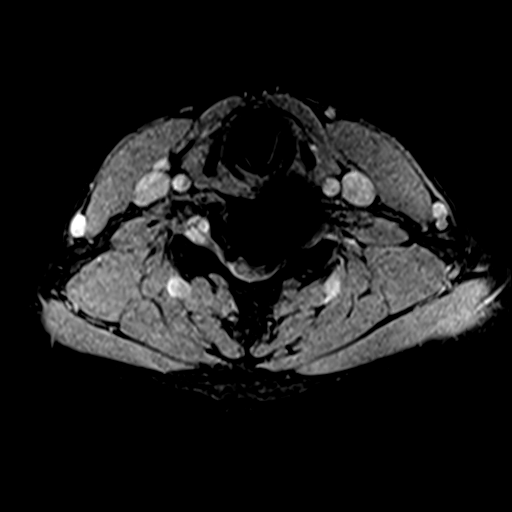
[im 15/29]
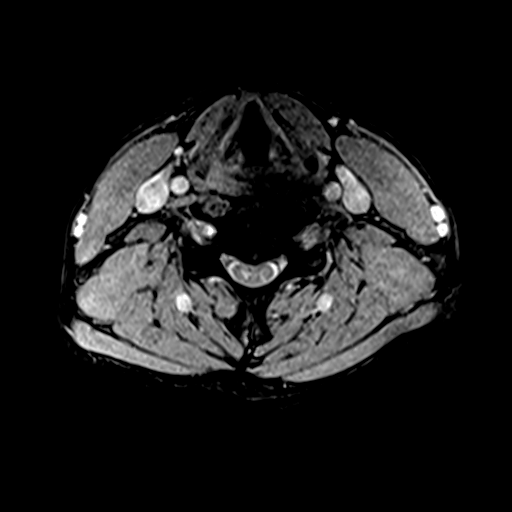
[im 17/29]
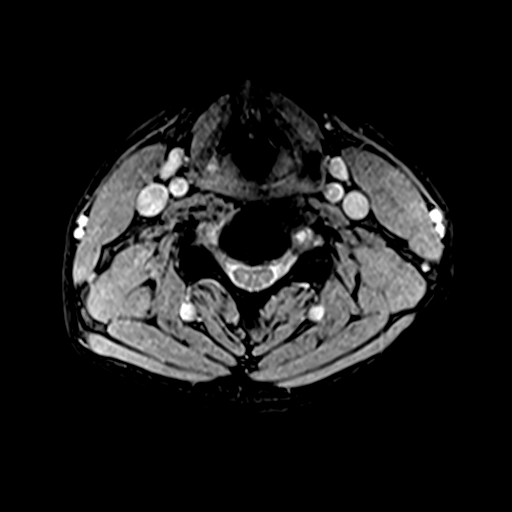
[im 19/29]
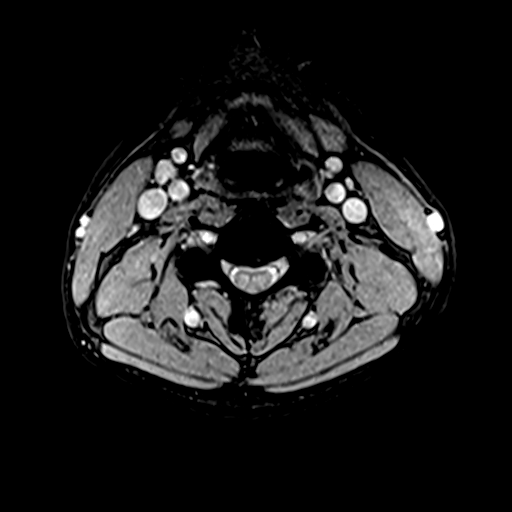
[im 24/29]
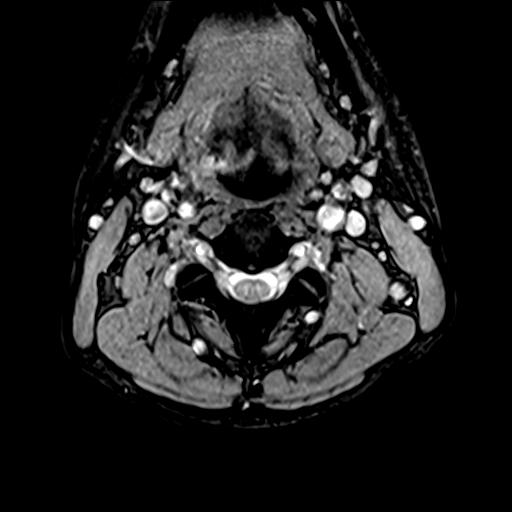
[im 29/29]
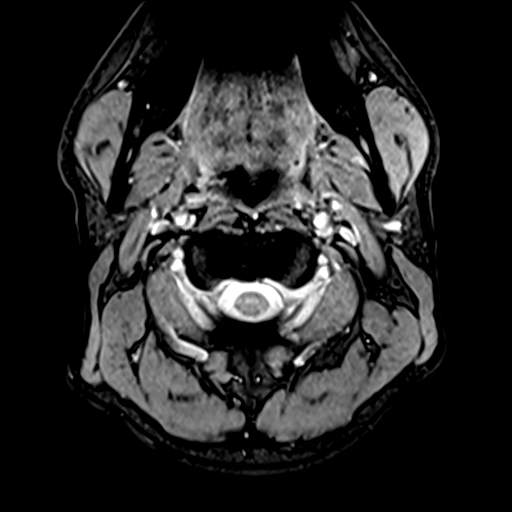

[Series 9: t2_axial · axial · 3.0mm · 0.70mm/px · z∈[-78,-8]mm · 6 of 29 slices shown]
[im 1/29]
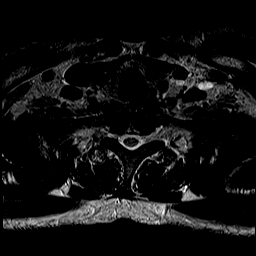
[im 5/29]
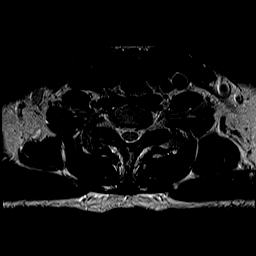
[im 10/29]
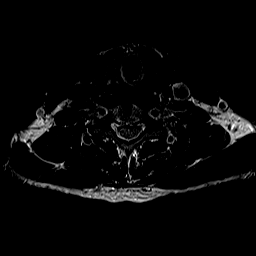
[im 12/29]
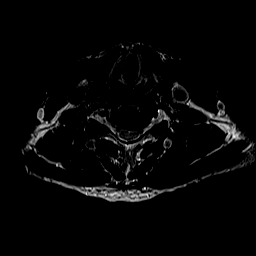
[im 17/29]
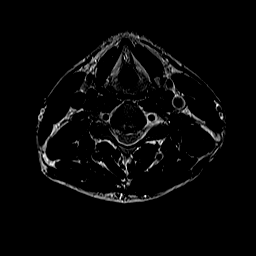
[im 19/29]
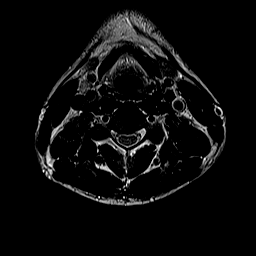

[37 of 48 positions shown; findings below may reference images not displayed]

FINDINGS: ALIGNMENT: No subluxations.

VERTEBRAE:  Mild height loss and increased AP diameter of the C3-C7 vertebral bodies most likely degenerative in etiology. Mild marrow edema is noted at C5, C6, and C7, which may be postsurgical in etiology.

INTERVERTEBRAL DISCS:  Previous C5-C6 and C6-C7 intervertebral disc replacement. Unfused discs demonstrate multilevel disc desiccation without significant height loss.

PARASPINAL SOFT TISSUES:  Allowing for metallic artifact, no significant paraspinal soft tissue signal abnormality.

SPINAL CORD:  Mild mass effect on the spinal cord at C5-C6. No abnormal spinal cord signal.

VISUALIZED POSTERIOR FOSSA: Unremarkable.

Findings by level:

C2-C3:  No high-grade canal stenosis or foraminal narrowing.

C3-C4:  No high-grade canal stenosis or foraminal narrowing.

C4-C5:  Disc osteophyte complex causes mild to moderate canal stenosis. No high-grade foraminal narrowing.

C5-C6:  Asymmetric left-sided disc osteophyte complex causes mild to moderate canal stenosis, improved from prior. Mild mass effect on the spinal cord without abnormal spinal cord signal. No high-grade foraminal narrowing.

C6-C7:  No high-grade canal stenosis or foraminal narrowing.

C7-T1:  No high-grade canal stenosis or foraminal narrowing.
IMPRESSION: 1.
Postoperative and multilevel spondylotic changes.

2.
Mild to moderate C4-C5 canal stenosis.

3.
Mild to moderate C5-C6 canal stenosis, improved from prior.
